# Patient Record
Sex: Female | Born: 1960
Health system: Southern US, Community
[De-identification: ages and names within clinical notes are randomized; demographics above are authoritative.]

## PROBLEM LIST (undated history)

## (undated) DIAGNOSIS — I10 Essential (primary) hypertension: Secondary | ICD-10-CM

## (undated) DIAGNOSIS — F419 Anxiety disorder, unspecified: Secondary | ICD-10-CM

## (undated) DIAGNOSIS — F329 Major depressive disorder, single episode, unspecified: Secondary | ICD-10-CM

## (undated) DIAGNOSIS — I872 Venous insufficiency (chronic) (peripheral): Secondary | ICD-10-CM

## (undated) DIAGNOSIS — E079 Disorder of thyroid, unspecified: Secondary | ICD-10-CM

## (undated) DIAGNOSIS — E785 Hyperlipidemia, unspecified: Secondary | ICD-10-CM

## (undated) DIAGNOSIS — F32A Depression, unspecified: Secondary | ICD-10-CM

## (undated) HISTORY — PX: BIOPSY THYROID: PRO38

## (undated) HISTORY — DX: Essential (primary) hypertension: I10

## (undated) HISTORY — PX: OTHER SURGICAL HISTORY: SHX169

## (undated) HISTORY — DX: Depression, unspecified: F32.A

## (undated) HISTORY — DX: Disorder of thyroid, unspecified: E07.9

## (undated) HISTORY — DX: Hyperlipidemia, unspecified: E78.5

## (undated) HISTORY — PX: BUNIONECTOMY: SHX129

## (undated) HISTORY — DX: Major depressive disorder, single episode, unspecified: F32.9

## (undated) HISTORY — DX: Anxiety disorder, unspecified: F41.9

## (undated) HISTORY — DX: Venous insufficiency (chronic) (peripheral): I87.2

---

## 1999-06-03 ENCOUNTER — Other Ambulatory Visit: Admission: RE | Admit: 1999-06-03 | Discharge: 1999-06-03 | Payer: Self-pay | Admitting: Obstetrics and Gynecology

## 2000-06-08 ENCOUNTER — Other Ambulatory Visit: Admission: RE | Admit: 2000-06-08 | Discharge: 2000-06-08 | Payer: Self-pay | Admitting: Obstetrics and Gynecology

## 2002-08-17 ENCOUNTER — Other Ambulatory Visit: Admission: RE | Admit: 2002-08-17 | Discharge: 2002-08-17 | Payer: Self-pay | Admitting: Obstetrics and Gynecology

## 2003-11-12 ENCOUNTER — Other Ambulatory Visit: Admission: RE | Admit: 2003-11-12 | Discharge: 2003-11-12 | Payer: Self-pay | Admitting: Obstetrics and Gynecology

## 2005-01-08 ENCOUNTER — Other Ambulatory Visit: Admission: RE | Admit: 2005-01-08 | Discharge: 2005-01-08 | Payer: Self-pay | Admitting: Obstetrics and Gynecology

## 2007-01-17 ENCOUNTER — Inpatient Hospital Stay (HOSPITAL_COMMUNITY): Admission: RE | Admit: 2007-01-17 | Discharge: 2007-01-18 | Payer: Self-pay | Admitting: Obstetrics and Gynecology

## 2007-01-17 ENCOUNTER — Encounter (INDEPENDENT_AMBULATORY_CARE_PROVIDER_SITE_OTHER): Payer: Self-pay | Admitting: *Deleted

## 2007-05-22 ENCOUNTER — Inpatient Hospital Stay (HOSPITAL_COMMUNITY): Admission: AD | Admit: 2007-05-22 | Discharge: 2007-05-22 | Payer: Self-pay | Admitting: Obstetrics and Gynecology

## 2007-11-04 ENCOUNTER — Encounter: Admission: RE | Admit: 2007-11-04 | Discharge: 2007-11-04 | Payer: Self-pay | Admitting: Endocrinology

## 2007-12-30 ENCOUNTER — Encounter: Admission: RE | Admit: 2007-12-30 | Discharge: 2007-12-30 | Payer: Self-pay | Admitting: Obstetrics and Gynecology

## 2009-02-08 ENCOUNTER — Ambulatory Visit: Payer: Self-pay | Admitting: Internal Medicine

## 2010-01-21 ENCOUNTER — Telehealth: Payer: Self-pay | Admitting: Cardiovascular Disease

## 2010-01-21 ENCOUNTER — Encounter: Payer: Self-pay | Admitting: Cardiovascular Disease

## 2010-02-12 DIAGNOSIS — E785 Hyperlipidemia, unspecified: Secondary | ICD-10-CM

## 2010-02-12 DIAGNOSIS — I10 Essential (primary) hypertension: Secondary | ICD-10-CM

## 2010-02-17 ENCOUNTER — Telehealth: Payer: Self-pay | Admitting: Cardiovascular Disease

## 2010-03-17 ENCOUNTER — Telehealth: Payer: Self-pay | Admitting: Cardiovascular Disease

## 2010-03-25 ENCOUNTER — Telehealth: Payer: Self-pay | Admitting: Cardiovascular Disease

## 2010-04-01 ENCOUNTER — Telehealth: Payer: Self-pay | Admitting: Cardiovascular Disease

## 2010-04-15 ENCOUNTER — Ambulatory Visit: Payer: Self-pay | Admitting: Cardiovascular Disease

## 2010-05-20 ENCOUNTER — Telehealth (INDEPENDENT_AMBULATORY_CARE_PROVIDER_SITE_OTHER): Payer: Self-pay | Admitting: *Deleted

## 2010-06-02 ENCOUNTER — Telehealth: Payer: Self-pay | Admitting: Cardiovascular Disease

## 2010-06-16 ENCOUNTER — Telehealth: Payer: Self-pay | Admitting: Cardiovascular Disease

## 2010-06-17 ENCOUNTER — Encounter: Payer: Self-pay | Admitting: Cardiovascular Disease

## 2010-10-29 ENCOUNTER — Encounter: Payer: Self-pay | Admitting: Cardiovascular Disease

## 2010-11-06 ENCOUNTER — Ambulatory Visit: Payer: Self-pay | Admitting: Cardiovascular Disease

## 2010-11-07 ENCOUNTER — Encounter: Payer: Self-pay | Admitting: Cardiovascular Disease

## 2010-11-10 ENCOUNTER — Ambulatory Visit: Payer: Self-pay | Admitting: Cardiovascular Disease

## 2010-11-20 ENCOUNTER — Encounter
Admission: RE | Admit: 2010-11-20 | Discharge: 2010-11-20 | Payer: Self-pay | Source: Home / Self Care | Attending: Obstetrics and Gynecology | Admitting: Obstetrics and Gynecology

## 2010-12-25 NOTE — Progress Notes (Signed)
Summary: BP med qestion  Phone Note Call from Patient Call back at [phone]   Caller: SELF Call For: [facility] Summary of Call: PT STATES THAT SHE WAS EXPECTING A CALL FROM THE NURSE YESTERDAY Initial call taken by: Harlon Flor,  Mar 25, 2010 10:43 AM  Follow-up for Phone Call        Called spoke with pt.  Pt states she was previously on Exforge when she saw Dr Lenard Galloway at [facility] in Nov or Dec of last year.  Had gone off all meds and was given a sample of Diovan to take as needed.  Pt states the Diovan given was not controlling BP's and she is now out of samples.  She has been out of Diovan x 2 weeks and her BP remains elevated.  Pt qustioning whether she needs rx to resume Exforge which she was prev on and was controlling BP's or should she come into office to be seen?? Follow-up by: Cloyde Reams RN,  Mar 25, 2010 11:35 AM

## 2010-12-25 NOTE — Progress Notes (Signed)
Summary: Medications  Phone Note Call from Patient Call back at [phone]   Caller: Patient Call For: RN Summary of Call: Patient is coming by this week to sign a medical record release and have her records sent from [facility] and set-up appointment.  She would like for RN to call her today to discuss a change in her medications. Initial call taken by: [place],  January 21, 2010 2:14 PM  Follow-up for Phone Call        per pt- BP has become elevated again.  Running 165/92.  Pt calling to see if Dr Mariah Milling wants her to start diovan 40mg  or 80mg  (given to her at last visit @ [facility]).  Follow-up by: Charlena Cross, RN, BSN,  January 21, 2010 3:07 PM     Appended Document: Medications per Dr. Mariah Milling- start Diovan 80mg .  When pt is out of samples we can call in losartan 100 mg daily. Charlena Cross RN BSN

## 2010-12-25 NOTE — Progress Notes (Signed)
Summary: Samples given for Vytorin 10/20 mg  Phone Note Call from Patient   Caller: Patient Summary of Call: Patient called with samples of Vytorin 10/20mg  x 3 weeks supply. Initial call taken by: Bishop Dublin, CMA,  May 20, 2010 2:06 PM

## 2010-12-25 NOTE — Progress Notes (Signed)
Summary: EDEMA  Phone Note Call from Patient Call back at [phone]   Caller: SELF Call For: [facility] Summary of Call: PT IS HAVING SWELLING IN HER HANDS AND ANKLES-HAS GAINED 15 POUNDS IN 6 WEEKS-PT HAD JUST STARTED AMLODEPINE Initial call taken by: Harlon Flor,  Apr 01, 2010 2:46 PM  Follow-up for Phone Call        Called spoke with pt.  BP better since start of Amlodipine 128/80.  Incr swelling in hands and ankles x 6 weeks.  Per Dr Mariah Milling OK to try HCTZ 25mg  once daily and d/c Amlodipine, or start Lasix 20mg  once daily as needed with Amlodipine.  Pt will monitor BP's while taking Amlodipine and as needed Lasix per pt choice.  Will call back if having to take Lasix daily. Follow-up by: Cloyde Reams RN,  Apr 01, 2010 5:11 PM    New/Updated Medications: FUROSEMIDE 20 MG TABS (FUROSEMIDE) Take 1 tablet by mouth once a day as needed Prescriptions: FUROSEMIDE 20 MG TABS (FUROSEMIDE) Take 1 tablet by mouth once a day as needed  #30 x 1   Entered by:   Cloyde Reams RN   Authorized by:   Dossie Arbour MD   Signed by:   Cloyde Reams RN on 04/01/2010   Method used:   Electronically to        [facility] [phone]* (retail)       [address] [place], [place]  [address]       Ph: [phone]       Fax: [phone]   RxID:   [id number]

## 2010-12-25 NOTE — Progress Notes (Signed)
Summary: SAMPLES vytorin 10/20mg   Phone Note Call from Patient   Summary of Call: PT CALLED AND STATED DR. Philo Kurtz WOULD GIVE HER SAMPLES OF VYTORIN IF NEEDED.  PLEASE LET HER KNOW IF SHE CAN PICK SOME UP.   [phone] Initial call taken by: Park Breed,  June 02, 2010 3:16 PM  Follow-up for Phone Call        Phone Call Completed Follow-up by: Bishop Dublin, CMA,  June 02, 2010 4:14 PM    Prescriptions: VYTORIN 10-20 MG TABS (EZETIMIBE-SIMVASTATIN) 1 tab at bedtime  #30 x 6   Entered by:   Bishop Dublin, CMA   Authorized by:   Dossie Arbour MD   Signed by:   Bishop Dublin, CMA on 06/02/2010   Method used:   Print then Give to Patient   RxID:   [id number]

## 2010-12-25 NOTE — Assessment & Plan Note (Signed)
Summary: F6M/AMD   Visit Type:  Follow-up Primary Provider:  Malva Limes  CC:  Denies chest pain or shortness of breath..  History of Present Illness: Carla [name] is a 50 old woman with a history of baseline bradycardia, hyperlipidemia, hypertension with strong family history of coronary artery disease who presents for routine followup.   Overall Carla [name] states that she is doing well. She denies any chest pain, shortness of breath. Otherwise she is active. She has gained several pounds and is trying to lose it. she has taken herself off amlodipine and reports that her blood pressure has been well-controlled on no medication. She has been able to tolerate her cholesterol medication with no difficulty and has been taking Vytorin 5/20 mg daily.  Overall she has no complaints.   EKG shows normal sinus rhythm with rate 57 beats per minute, no significant ST or T wave changes  Preventive Screening-Counseling & Management  Alcohol-Tobacco     Smoking Status: never  Caffeine-Diet-Exercise     Does Patient Exercise: yes  Current Medications (verified): 1)  Vytorin 10-40 Mg Tabs (Ezetimibe-Simvastatin) .... Take 1/2-1 Tablet By Mouth Once A Day 2)  Aspirin 81 Mg Tbec (Aspirin) .... Take One Tablet By Mouth Daily 3)  Multivitamins   Tabs (Multiple Vitamin) .Marland Kitchen.. 1 Tab Once Daily 4)  Fish Oil 1200 Mg Caps (Omega-3 Fatty Acids) .Marland Kitchen.. 1 Cap Once Daily 5)  Calcium 600 Mg Tabs (Calcium) .Marland Kitchen.. 1 Tab Once Daily 6)  Vitamin E 400 Unit Caps (Vitamin E) .Marland Kitchen.. 1 Cap Once Daily 7)  Estraderm 0.1 Mg/24hr Pttw (Estradiol) .... Change Once A Week.  Allergies (verified): No Known Drug Allergies  Past History:  Past Medical History: Last updated: 02/12/2010 Hyperlipidemia Hypertension  Family History: Last updated: 11/06/2010 Father: Living; CAD; s/p CABG x 4, Hypertension Mother: Deceased; cancer   Social History: Last updated: 11/06/2010 Part Time  Tobacco Use - No.  Alcohol Use -  yes Regular Exercise - yes  Risk Factors: Exercise: yes (11/06/2010)  Risk Factors: Smoking Status: never (11/06/2010)  Past Surgical History: hysterectomy  Family History: Father: Living; CAD; s/p CABG x 4, Hypertension Mother: Deceased; cancer   Social History: Part Time  Tobacco Use - No.  Alcohol Use - yes Regular Exercise - yes Smoking Status:  never Does Patient Exercise:  yes  Review of Systems  The patient denies fever, weight loss, weight gain, vision loss, decreased hearing, hoarseness, chest pain, syncope, dyspnea on exertion, peripheral edema, prolonged cough, abdominal pain, incontinence, muscle weakness, depression, and enlarged lymph nodes.    Vital Signs:  Patient profile:   50 year old female Height:      63 inches Weight:      162 pounds BMI:     28.80 Pulse rate:   57 / minute BP sitting:   118 / 68  (left arm) Cuff size:   regular  Vitals Entered By: Lysbeth Galas CMA (November 06, 2010 3:47 PM)  Physical Exam  General:  Well developed, well nourished, in no acute distress. Head:  normocephalic and atraumatic Neck:  Neck supple, no JVD. No masses, thyromegaly or abnormal cervical nodes. Lungs:  Clear bilaterally to auscultation and percussion. Heart:  Non-displaced PMI, chest non-tender; regular rate and rhythm, S1, S2 without murmurs, rubs or gallops. Carotid upstroke normal, no bruit.  Pedals normal pulses. No edema, no varicosities. Msk:  Back normal, normal gait. Muscle strength and tone normal. Pulses:  pulses normal in all 4 extremities Extremities:  No clubbing  or cyanosis. Neurologic:  Alert and oriented x 3. Skin:  Intact without lesions or rashes. Psych:  Normal affect.   Impression & Recommendations:  Problem # 1:  HYPERLIPIDEMIA-MIXED (ICD-272.4) cholesterol is well controlled on her current medication regimen. We have suggested that she stay on her current dose and try to lose several pounds by watching her diet and  walking.  Her updated medication list for this problem includes:    Vytorin 10-40 Mg Tabs (Ezetimibe-simvastatin) .Marland Kitchen... Take 1/2-1 tablet by mouth once a day  Problem # 2:  HYPERTENSION, BENIGN (ICD-401.1) Blood pressure appears to be well controlled without any medications. I've asked her to monitor her blood pressure.  The following medications were removed from the medication list:    Amlodipine Besylate 5 Mg Tabs (Amlodipine besylate) .Marland Kitchen... Take one tablet by mouth daily    Losartan Potassium 100 Mg Tabs (Losartan potassium) .Marland Kitchen... Take 1 tablet by mouth once a day Her updated medication list for this problem includes:    Aspirin 81 Mg Tbec (Aspirin) .Marland Kitchen... Take one tablet by mouth daily  Patient Instructions: 1)  Your physician recommends that you schedule a follow-up appointment in: 1 year 2)  Your physician recommends that you continue on your current medications as directed. Please refer to the Current Medication list given to you today. Prescriptions: VYTORIN 10-40 MG TABS (EZETIMIBE-SIMVASTATIN) Take 1/2-1 tablet by mouth once a day  #30 x 6   Entered by:   Lanny Hurst RN   Authorized by:   Dossie Arbour MD   Signed by:   Lanny Hurst RN on 11/06/2010   Method used:   Print then Give to Patient   RxID:   [id number]

## 2010-12-25 NOTE — Progress Notes (Signed)
Summary: Vytorin  Phone Note Call from Patient   Caller: Patient Call For: [place] Summary of Call: pt called and wanted to know if we could prescribe her vytorin for 10/40 instead of 10/20.  She is doing mail order and would like to cut the pills in half with a 90 day supply. Please advise.  Initial call taken by: Benedict Needy, RN,  June 16, 2010 1:50 PM  Follow-up for Phone Call        That is ok... we still need cholesterol numbers from Dr. Adrian Prince She might not need an expensive med if numbers are ok. Lets check lab first     Appended Document: Vytorin LMOM of medical records of Dr. Jeannett Senior [place]'s office to get Lipid and liver panels  Appended Document: Vytorin lab report is here from Dr. Zonia Kief please advise on vytorin  Appended Document: Vytorin ok to cut in 1/2  Appended Document: Vytorin LMOM [facility]  Appended Document: Vytorin    Clinical Lists Changes  Medications: Changed medication from VYTORIN 10-20 MG TABS (EZETIMIBE-SIMVASTATIN) 1 tab at bedtime to VYTORIN 10-40 MG TABS (EZETIMIBE-SIMVASTATIN) Take 1/2-1 tablet by mouth once a day - Signed Rx of VYTORIN 10-40 MG TABS (EZETIMIBE-SIMVASTATIN) Take 1/2-1 tablet by mouth once a day;  #90 x 4;  Signed;  Entered by: Benedict Needy, RN;  Authorized by: Dossie Arbour MD;  Method used: Print then Mail to Patient    Prescriptions: VYTORIN 10-40 MG TABS (EZETIMIBE-SIMVASTATIN) Take 1/2-1 tablet by mouth once a day  #90 x 4   Entered by:   Benedict Needy, RN   Authorized by:   Dossie Arbour MD   Signed by:   Benedict Needy, RN on 06/20/2010   Method used:   Print then Mail to Patient   RxID:   [id number]  spoke to pt on telephone she requested that the prescription be mailed to her directly.

## 2010-12-25 NOTE — Assessment & Plan Note (Signed)
Summary: NEW PT   Visit Type:  new pt visit Primary Provider:  Malva Limes  CC:  dizziness and edema...denies any other complaints today.  History of Present Illness: Carla [name] is a 46 old woman with a history of baseline bradycardia, hyperlipidemia, hypertension with strong family history of coronary artery disease who presents to reestablish care. She was last seen at [facility] December 2010.  Overall Ms. Moya states that she is doing well. She does have episodes of dizziness, feels more fatigued and has some malaise of uncertain etiology. She's noticed more swelling in her legs. She does use a diuretic on an occasional basis. She denies any chest pain, shortness of breath. Otherwise she is active. She has gained several pounds and is trying to lose it.  Current Medications (verified): 1)  Amlodipine Besylate 5 Mg Tabs (Amlodipine Besylate) .... Take One Tablet By Mouth Daily 2)  Vytorin 10-20 Mg Tabs (Ezetimibe-Simvastatin) .Marland Kitchen.. 1 Tab At Bedtime 3)  Aleve 220 Mg Tabs (Naproxen Sodium) .... 2 Tabs Two Times A Day 4)  Aspirin 81 Mg Tbec (Aspirin) .... Take One Tablet By Mouth Daily 5)  Multivitamins   Tabs (Multiple Vitamin) .Marland Kitchen.. 1 Tab Once Daily 6)  Fish Oil 1200 Mg Caps (Omega-3 Fatty Acids) .Marland Kitchen.. 1 Cap Once Daily 7)  Calcium 600 Mg Tabs (Calcium) .Marland Kitchen.. 1 Tab Once Daily 8)  Vitamin E 400 Unit Caps (Vitamin E) .Marland Kitchen.. 1 Cap Once Daily  Allergies (verified): No Known Drug Allergies  Review of Systems       The patient complains of weight gain and peripheral edema.  The patient denies fever, weight loss, vision loss, decreased hearing, hoarseness, chest pain, syncope, dyspnea on exertion, prolonged cough, abdominal pain, incontinence, muscle weakness, depression, and enlarged lymph nodes.    Vital Signs:  Patient profile:   50 year old female Height:      63 inches Weight:      165 pounds BMI:     29.33 Pulse rate:   51 / minute Pulse rhythm:    irregular BP sitting:   131 / 82  (left arm) Cuff size:   large  Vitals Entered By: Danielle Rankin, CMA (Apr 15, 2010 11:06 AM)  Physical Exam  General:  Well developed, well nourished, in no acute distress. Head:  normocephalic and atraumatic Mouth:  Teeth, gums and palate normal. Oral mucosa normal. Neck:  Neck supple, no JVD. No masses, thyromegaly or abnormal cervical nodes. Lungs:  Clear bilaterally to auscultation and percussion. Heart:  Non-displaced PMI, chest non-tender; regular rate and rhythm, S1, S2 without murmurs, rubs or gallops. Carotid upstroke normal, no bruit.  Pedals normal pulses. No edema, no varicosities. Abdomen:  Bowel sounds positive; abdomen soft and non-tender without masses, organomegaly, or hernias noted. No hepatosplenomegaly. Msk:  Back normal, normal gait. Muscle strength and tone normal. Pulses:  pulses normal in all 4 extremities Extremities:  No clubbing or cyanosis. Neurologic:  Alert and oriented x 3. Skin:  Intact without lesions or rashes. Psych:  Normal affect.    EKG  Procedure date:  04/15/2010  Findings:      normal sinus rhythm with rate 51 beats per minute, no significant ST or T wave changes.  Impression & Recommendations:  Problem # 1:  HYPERTENSION, BENIGN (ICD-401.1) she states that her blood pressure has been well-controlled O. she does not feel well on amlodipine 5 mg daily. She has malaise, dizziness, lower extremity edema. On exam there is no significant  edema. She does have baseline bradycardia which was seen on her last visit in December. It could be that the bradycardia in the setting of a vasodilator has caused her symptoms.  We will change her to losartan 50 mg daily after a amlodipine holiday. I will Have her monitor her blood pressure and pulse rate at home.  The following medications were removed from the medication list:    Furosemide 20 Mg Tabs (Furosemide) .Marland Kitchen... Take 1 tablet by mouth once a day as needed Her  updated medication list for this problem includes:    Amlodipine Besylate 5 Mg Tabs (Amlodipine besylate) .Marland Kitchen... Take one tablet by mouth daily    Aspirin 81 Mg Tbec (Aspirin) .Marland Kitchen... Take one tablet by mouth daily    Losartan Potassium 100 Mg Tabs (Losartan potassium) .Marland Kitchen... Take 1 tablet by mouth once a day  Problem # 2:  HYPERLIPIDEMIA-MIXED (ICD-272.4) we'll continue her on the Vytorin for now. She states that she takes one half tablet per day. We'll check her cholesterol which was done through Dr. Adrian Prince. she does have a strong family history and we will continue to treat her aggressively.  Her updated medication list for this problem includes:    Vytorin 10-20 Mg Tabs (Ezetimibe-simvastatin) .Marland Kitchen... 1 tab at bedtime  Patient Instructions: 1)  Your physician recommends that you schedule a follow-up appointment in: 6 months. 2)  Your physician has recommended you make the following change in your medication: Start taking Losartan 100mg  once daily. Prescriptions: LOSARTAN POTASSIUM 100 MG TABS (LOSARTAN POTASSIUM) Take 1 tablet by mouth once a day  #30 x 6   Entered by:   Cloyde Reams RN   Authorized by:   Dossie Arbour MD   Signed by:   Cloyde Reams RN on 04/15/2010   Method used:   Electronically to        [facility] [phone]* (retail)       [address] [place], [place]  [address]       Ph: [phone]       Fax: [phone]   RxID:   [id number]

## 2010-12-25 NOTE — Progress Notes (Signed)
Summary: SAMPLES  Phone Note Call from Patient Call back at [phone]   Caller: self Call For: Carla [name] Summary of Call: PT WOULD LIKE DIOVAN SAMPLES-#[phone] Initial call taken by: Harlon Flor,  March 17, 2010 8:06 AM  Follow-up for Phone Call        pt switched from Exforge to Diovan because of issues with BP control.  Pt has not felt good since switching and would like to try a different medication instead of diovan for BP.  Pt aware that Dr. Mariah Milling is away and will return next week.  Pt will monitor BP while off diovan and call back if BP becomes elevated.  Please call pt back with medication changes.  Follow-up by: Charlena Cross, RN, BSN,  March 17, 2010 9:09 AM  Additional Follow-up for Phone Call Additional follow up Details #1::        PT THINKS THAT SHE NEEDS TO SEE Carla [name] THIS WEEK-PT IS NOT TAKING ANY MEDICATION Additional Follow-up by: Harlon Flor,  Mar 24, 2010 8:28 AM    Additional Follow-up for Phone Call Additional follow up Details #2::    Do you want to see Patient?. she is requesting samples of Diovan-  there are no samples in stock. Stanton Kidney, EMT-P  Mar 24, 2010 10:40 AM   Additional Follow-up for Phone Call Additional follow up Details #3:: Details for Additional Follow-up Action Taken: we could start her on the generic of diovan (losartan 100 mg daily, start with half. Or could start amlodipine 5 mg daily   Appended Document: SAMPLES    Phone Note Outgoing Call   Call placed by: Cloyde Reams RN,  Mar 26, 2010 2:04 PM Call placed to: Patient Summary of Call: Spoke with pt.  Advised per Dr Mariah Milling Amlodipine and losartan are the 2 medications in Exforge.  D/C Exforge  perviously d/t decr BP's therefor Dr Mariah Milling was recommending to split the meds and try, since she has tried Diovan without success, recommended she try Amlodipine.  Pt agrees going to try 30 day supply to [facility] and [facility] if too expensive, or [facility] to get 90 day rx, or [facility] if not  controlling BP's.  Initial call taken by: Cloyde Reams RN,  Mar 26, 2010 2:12 PM  Follow-up for Phone Call        Pt aware rx sent to pharmacy. Follow-up by: Cloyde Reams RN,  Mar 26, 2010 2:13 PM    New/Updated Medications: AMLODIPINE BESYLATE 5 MG TABS (AMLODIPINE BESYLATE) Take one tablet by mouth daily Prescriptions: AMLODIPINE BESYLATE 5 MG TABS (AMLODIPINE BESYLATE) Take one tablet by mouth daily  #30 x 0   Entered by:   Cloyde Reams RN   Authorized by:   Dossie Arbour MD   Signed by:   Cloyde Reams RN on 03/26/2010   Method used:   Electronically to        [facility] [address] (retail)       [address]       [place], [place]  [address]       Ph: [phone]       Fax: [phone]   RxID:   [id number]

## 2010-12-25 NOTE — Letter (Signed)
Summary: Medical Record Release  Medical Record Release   Imported By: Harlon Flor 02/14/2010 07:54:03  _____________________________________________________________________  External Attachment:    Type:   Image     Comment:   External Document

## 2010-12-25 NOTE — Progress Notes (Signed)
Summary: sample pick up  Phone Note Call from Patient   Caller: Patient Summary of Call: pt calling to see if we had samples of  vytorin. pt will pick up samples at front desk. kimberly  ~:) Initial call taken by: Mercer Pod,  February 17, 2010 2:33 PM

## 2011-02-05 ENCOUNTER — Ambulatory Visit: Payer: Self-pay | Admitting: Podiatry

## 2011-04-10 NOTE — Op Note (Signed)
[name], [name]               ACCOUNT NO.:  [id number]   MEDICAL RECORD NO.:  [id number]          PATIENT TYPE:  INP   LOCATION:  9312                          FACILITY:  [facility]   PHYSICIAN:  Malva Limes, M.D.    DATE OF BIRTH:  [birth year]   DATE OF PROCEDURE:  04/19/2007  DATE OF DISCHARGE:  01/18/2007                               OPERATIVE REPORT   PREOPERATIVE DIAGNOSES:  1. Menorrhagia.  2. Stress urinary incontinence.  3. Symptomatic cystocele and rectocele.   POSTOPERATIVE DIAGNOSES:  1. Menorrhagia.  2. Stress urinary incontinence.  3. Symptomatic cystocele and rectocele.   PROCEDURE:  Laparoscopic-assisted bilateral salpingo-oophorectomy,  anterior and posterior colporrhaphy, placement of tension-free vaginal  tape, cystoscopy, and excision of right paravaginal cyst.   SURGEON:  Malva Limes, M.D.   ASSISTANT:  Randye Lobo, M.D.   ANESTHESIA:  General endotracheal.   ANTIBIOTICS:  Ancef 1 gram.   DRAINS:  Foley to bedside drainage.   ESTIMATED BLOOD LOSS:  200 mL.   SPECIMENS:  Uterus, cervix, ovaries, fallopian tubes sent to pathology.   PROCEDURE:  The patient was taken to the operating room where general  anesthetic was administered without complication.  She was then placed  in the dorsal lithotomy position and prepped with Betadine.  A Foley  catheter was placed in her bladder.  She was then draped in the usual  fashion for this procedure.  Her umbilicus was injected with 0.25%  Marcaine.   A vertical skin incision was made. The fascia was grasped and entered  with the Mayo scissors.  The peritoneum was entered bluntly with  hemostats.  An 0 Vicryl suture was placed in a pursestring fashion.  The  trocar was placed in the abdominal cavity.  3 liters of carbon dioxide  was insufflated.  The scope was then placed, and examination revealed  that the patient had no evidence of adhesions or endometriosis.  She did  have several small fibroids.  The  fallopian tubes and ovaries were  normal bilaterally.  At this point, 5-mm ports were placed in the right  and left lower quadrant under direct visualization.  The right ureter  was then identified.  The infundibulopelvic ligament was grasped and  cauterized and transected.  Next, the right round ligament was  cauterized and transected, and the broad ligament was cauterized and  transected down to the level of the uterine artery.  A similar procedure  was performed on the opposite side.   Once this was concluded, attention was placed in the vagina.  The gas in  the abdominal cavity was released and the scope removed.  A weighted  speculum was placed in the vagina.  The cervix was injected with 1%  lidocaine with epinephrine. The posterior cul-de-sac was entered  sharply.  Uterosacral ligaments were bilaterally clamped, cut, and  ligated with 0 Monocryl suture.  The cervix was then circumscribed, and  the anterior cul-de-sac was entered sharply.  The cardinal ligaments  were serially clamped, cut, and ligated with 0 Monocryl suture.  The  uterine vessels were then bilaterally clamped, cut,  and ligated with 0  Monocryl suture.  The specimen was then removed.  At this point the  posterior cuff was run using 2-0 Vicryl suture in a running locking  fashion.  Cul-de-sac repair was then performed.  At this point, Dr.  Edward Jolly took over the procedure.  Please see her dictated operative note.           ______________________________  Malva Limes, M.D.     MA/MEDQ  D:  04/19/2007  T:  04/19/2007  Job:  [id number]

## 2011-04-10 NOTE — Op Note (Signed)
Carla [name], Carla [name]               ACCOUNT NO.:  [id number]   MEDICAL RECORD NO.:  [id number]          PATIENT TYPE:  INP   LOCATION:  9312                          FACILITY:  [facility]   PHYSICIAN:  Randye Lobo, M.D.   DATE OF BIRTH:  1961-04-27   DATE OF PROCEDURE:  01/17/2007  DATE OF DISCHARGE:                               OPERATIVE REPORT   PREOPERATIVE DIAGNOSES.:  1. Genuine stress incontinence.  2. Incomplete uterovaginal prolapse.   POSTOPERATIVE DIAGNOSES:  1. Genuine stress incontinence.  2. Incomplete uterovaginal prolapse.  3. Paravaginal cyst.   PROCEDURE:  1. Tension-free vaginal tape.  2. Suburethral sling with cystoscopy.  3. Excision of paravaginal cyst.  4. Anterior and posterior colporrhaphy.   SURGEON:  Randye Lobo, M.D.   ASSISTANT:  Malva Limes, MD.   ANESTHESIA:  General endotracheal.   ESTIMATED BLOOD LOSS:  100 mL.   COMPLICATIONS:  None.   INDICATIONS FOR PROCEDURE:  The patient is a 50 year old para 1  Caucasian female who is referred by Dr. Malva Limes for evaluation of  urinary incontinence.  The patient is planning laparoscopically assisted  vaginal hysterectomy with bilateral salpingo oophorectomy with Dr.  Dareen Piano; due to a history of symptomatic fibroids and abnormal Pap  smears.  The patient reports a history of urinary incontinence with  stressful maneuvers, in addition to urge-related incontinence.  Urodynamic testing in the office confirmed the presence of genuine  stress incontinence.   On physical examination the patient was noted to have a first-degree  cystocele and a first-degree rectocele; on further questioning, the  patient reports a history of constipation with the need for perineal  splinting and digital vaginal pressure to have bowel movements.   The patient desires to proceed with surgical treatment for her urinary  incontinence and her prolapse at the time of her hysterectomy with Dr.  Dareen Piano.  The risks,  benefits and alternatives have been discussed with  the patient, who wishes to proceed.   FINDINGS:  The patient is noted to have a first-degree cystocele and a  first-degree rectocele.   During the dissection of the anterior vaginal wall for the anterior  colporrhaphy and the sling, a 3 cm x 2 cm right paravaginal cyst was  encountered, just under the level of the vaginal mucosa, anterior to the  bladder and lateral to the urethra.  The cyst contained greenish mucousy  material.  This cyst was opened and explored, and there was no  communication with the bladder or the urethra.   Cystoscopy demonstrated a normal bladder throughout 360 degrees,  including the bladder dome and trigone.  The ureters were noted to be  patent bilaterally.  There was no evidence of a foreign body in the  bladder or the urethra during placement of the sling.   SPECIMENS:  The paravaginal cyst wall was sent to pathology.   PROCEDURE:  The patient was brought to the operating room after she was  re-identified in the preoperative holding area.  She received general  endotracheal anesthesia and was then placed in the dorsal lithotomy  position.  At this point, Dr. Dareen Piano proceeded with the  laparoscopically assisted vaginal hysterectomy with bilateral salpingo-  oophorectomy.  Please refer to this dictation separately.   After Dr. Dareen Piano did a running locked suture of the posterior vaginal  cuff, a Foley catheter was placed inside the patient's bladder.  I then  took over the remainder of the procedure.   A McCall culdoplasty suture was placed in the posterior vaginal cuff.  This suture was brought through the vagina and into the cul-de-sac at  the 6 o'clock position, through the distal left uterosacral ligament,  across the posterior cul-de-sac in a pursestring fashion, down through  the distal right uterosacral ligament, and then out through the vagina  again at the 6 o'clock position.  This suture  was held and tied at the  end of the case, at which time it provided excellent support and  elevation of the vaginal cuff.   Allis clamps were then used to mark the anterior vaginal wall, which was  injected with 1% lidocaine with 1:100,000 of epinephrine.  The anterior  vaginal wall mucosa was then opened vertically with the Metzenbaum  scissors.  The bladder was dissected off of the overlying vaginal tissue  bilaterally.  During the course of this dissection the right paravaginal  cyst was encountered.  The cyst was opened and was explored with a  uterine sound, and found to have no communication with the bladder or  the urethra.  The remainder of the cyst wall was therefore opened and  excess cyst wall was excised and sent to pathology.  The base of the  cyst was cauterized.  The small portion of the cyst which remained was  then sutured close with a running locked suture of 2-0 Vicryl.  The  dissection for the anterior colporrhaphy was completed and was brought  to the level of the pubic rami anteriorly, and down to the level of the  uterosacral ligaments inferiorly.   The sling was performed next in a top-down fashion.  Then 1 cm incisions  were marked 2.5 cm to the right and left of the midline.  With a Foley  catheter left to gravity drainage, the abdominal needle passer was  placed; first through the right suprapubic incision, and then out  through the vagina at the level of the mid urethra and lateral to it on  the ipsilateral side.  The same procedure that was performed on the  right-hand side was then repeated on the left-hand side.  The Foley  catheter was removed at this time and cystoscopy was performed in the  findings are as noted above.  The Foley catheter was then placed in  again and all cystoscopy fluid was drained.  The tension-free vaginal tape sling was then attached to the abdominal needle passers and was  brought up through the suprapubic incisions bilaterally.   Final tension  on the sling was adjusted.  Then a Kelly clamp was placed between the  sling and the urethra, and the plastic sheaths were removed from the  surrounding sling.  Excess sling material was excised.  The anterior  colporrhaphy was then performed with vertical mattress sutures of 2-0  Vicryl, for excellent reduction of the cystocele.  Excess vaginal mucosa  was trimmed and the anterior vaginal wall was closed with a running  locked suture of 2-0 Vicryl.  The vaginal cuff was then closed with a  running locked suture of 0 Vicryl.  Hemostasis was noted to be good.  The posterior colporrhaphy was performed last.  Allis clamps were used  to mark the midline of the posterior vaginal wall.  The vaginal mucosa  was injected with 1% lidocaine with 1:100,000 of epinephrine.  A  triangular wedge of epithelium was excised from the perineal body.  The  vaginal mucosa was then incised vertically with the Metzenbaum scissors.  The perirectal fascia was dissected off of the overlying vaginal mucosa  bilaterally to the top of the patient's rectocele.  The rectocele repair  was then performed with a series of vertical mattress sutures of 2-0  Vicryl.  The top suture was actually a pursestring of 2-0 Vicryl for  reduction at the top of the rectocele.  A crown stitch of 0 Vicryl was  placed at the level of the perineal body.  Excess vaginal mucosa was  then trimmed and the posterior vaginal wall was closed with a running  locked suture of 2-0 Vicryl, which was continued along the perineum as  for a subcuticular episiotomy closure.   There was some bleeding noted along the mucosal edge of the anterior  vaginal wall, and this was controlled with a figure-of-eight suture of 2-  0 Vicryl.  A packing with Estrace cream was then placed inside the  vagina.   The suprapubic incisions were closed with subcuticular sutures of 3-0  Vicryl.  Final rectal exam confirmed the absence of suture in the   rectum.   Dr. Dareen Piano performed final laparoscopy at this time, and then  performed closure of the abdominal incisions.  Again, please refer to  this dictation separately.   This concluded the patient's procedure.  There were no complications.  All needle, instrument, sponge counts were correct.  The patient was  escorted to recovery room in stable and awake condition.      Randye Lobo, M.D.  Electronically Signed     BES/MEDQ  D:  01/17/2007  T:  01/17/2007  Job:  [id number]

## 2011-04-21 ENCOUNTER — Other Ambulatory Visit: Payer: Self-pay

## 2011-04-21 MED ORDER — EZETIMIBE-SIMVASTATIN 10-40 MG PO TABS: ORAL_TABLET | ORAL | Status: DC

## 2011-04-21 NOTE — Telephone Encounter (Signed)
She is taking Vytorin 10/40 mg take 1/2 - 1 one tablet daily.

## 2011-05-05 ENCOUNTER — Other Ambulatory Visit: Payer: Self-pay | Admitting: Obstetrics and Gynecology

## 2011-05-06 ENCOUNTER — Ambulatory Visit (INDEPENDENT_AMBULATORY_CARE_PROVIDER_SITE_OTHER): Payer: Managed Care, Other (non HMO) | Admitting: *Deleted

## 2011-05-06 ENCOUNTER — Encounter: Payer: Self-pay | Admitting: *Deleted

## 2011-05-06 ENCOUNTER — Telehealth: Payer: Self-pay | Admitting: Cardiovascular Disease

## 2011-05-06 DIAGNOSIS — R079 Chest pain, unspecified: Secondary | ICD-10-CM

## 2011-05-06 NOTE — Progress Notes (Signed)
Pt in today after calling this AM with c/o chest pressure and elevated BP of 160/110. Pt has fam hx of CAD. Pt denies SOB, and states that for the past week she has had pressure in chest, and has only checked BP for past 2 days and it has been elevated. Pt's EKG today shows NSR with HR 62 BP 146/89. Pt does state that her father-in-law's health is not doing well and they are anticipating his passing any time now, and states she is under a great amount of stress for this reason. Discussed with Dr. Mariah Milling, and have advised pt to see pcp for anxiety. Also advised pt to try walking, or any type of relaxing technique to help her through stress. Also notified pt that if symptoms persist or increase and she is concerned, per Dr. Mariah Milling, she can schedule a myoview to rule out blockage. Pt ok with this, and states she will call back with any concerns.

## 2011-05-06 NOTE — Telephone Encounter (Signed)
Spoke to pt, she also reports this AM onset of chest pressure for >1 week that has increased in intensity, and she has only been recording BP for past 2 days and has stayed significantly higher than her normal. She reports "not feeling right" for the past week. Advised pt come in now for EKG. Pt states she is on her way.

## 2011-05-06 NOTE — Telephone Encounter (Signed)
BP is 160/110.  Please advise.

## 2011-06-05 ENCOUNTER — Encounter: Payer: Self-pay | Admitting: Cardiovascular Disease

## 2011-09-09 LAB — URINALYSIS, ROUTINE W REFLEX MICROSCOPIC
Bilirubin Urine: NEGATIVE
Ketones, ur: NEGATIVE
Leukocytes, UA: NEGATIVE
Nitrite: NEGATIVE
Protein, ur: NEGATIVE
Specific Gravity, Urine: 1.015
Urobilinogen, UA: 0.2

## 2011-09-09 LAB — URINE MICROSCOPIC-ADD ON

## 2011-10-29 ENCOUNTER — Encounter: Payer: Self-pay | Admitting: Cardiovascular Disease

## 2011-11-06 ENCOUNTER — Encounter: Payer: Self-pay | Admitting: Cardiovascular Disease

## 2011-11-06 ENCOUNTER — Ambulatory Visit (INDEPENDENT_AMBULATORY_CARE_PROVIDER_SITE_OTHER): Payer: Managed Care, Other (non HMO) | Admitting: Cardiovascular Disease

## 2011-11-06 VITALS — BP 140/90 | HR 67 | Ht 63.0 in | Wt 173.0 lb

## 2011-11-06 DIAGNOSIS — I10 Essential (primary) hypertension: Secondary | ICD-10-CM

## 2011-11-06 DIAGNOSIS — E785 Hyperlipidemia, unspecified: Secondary | ICD-10-CM

## 2011-11-06 NOTE — Patient Instructions (Signed)
You are doing well. No medication changes were made.  Please call us if you have new issues that need to be addressed before your next appt.  The office will contact you for a follow up Appt. In 12 months  

## 2011-11-06 NOTE — Assessment & Plan Note (Signed)
We will try to obtain the lipid panel from Dr. Evlyn Kanner. She does have a very strong family history of coronary artery disease.

## 2011-11-06 NOTE — Assessment & Plan Note (Signed)
Blood pressure is borderline elevated. We have asked her to increase her exercise and watch her diet in an effort to lose weight. Her weight is up 10 pounds from her previous visit last year

## 2011-11-06 NOTE — Progress Notes (Signed)
Patient ID: Carla [name], female    DOB: Oct 10, 1961, 50 y.o.   MRN: [id number]  HPI Comments: Ms. Carla [name] is a 50 old woman with a history of baseline bradycardia, hyperlipidemia, hypertension with strong family history of coronary artery disease who presents for routine followup.     Overall Ms. Carla [name] states that she is doing well. She has gained 10 pounds over the past year from 162 pounds to 172 pounds.  She denies any chest pain, shortness of breath. Otherwise she is active.   She has been able to tolerate her cholesterol medication with no difficulty and has been taking Vytorin 5/20 mg daily. In the past, she was on amlodipine to stop this on her own Secondary to fatigue. Blood pressure today is mildly elevated. Overall she has no complaints.     She reports that cholesterol was recently done by Dr. Adrian Prince in [place] EKG shows normal sinus rhythm with rate 67 beats per minute, no significant ST or T wave changes      Outpatient Encounter Prescriptions as of 11/06/2011  Medication Sig Dispense Refill  . aspirin 81 MG tablet Take 81 mg by mouth daily.        . calcium carbonate (OS-CAL) 600 MG TABS Take 600 mg by mouth daily.        Marland Kitchen estradiol (ESTRADERM) 0.1 MG/24HR Place 1 patch onto the skin once a week.        . ezetimibe-simvastatin (VYTORIN) 10-40 MG per tablet Take 1/2 tablet by mouth everyday.  90 tablet  3  . Multiple Vitamin (MULTIVITAMIN) tablet Take 1 tablet by mouth daily.        . Omega-3 Fatty Acids (FISH OIL) 1200 MG CAPS Take 1 capsule by mouth daily.        . vitamin E 400 UNIT capsule Take 400 Units by mouth daily.          Review of Systems  Constitutional: Negative.   HENT: Negative.   Eyes: Negative.   Respiratory: Negative.   Cardiovascular: Negative.   Gastrointestinal: Negative.   Musculoskeletal: Negative.   Skin: Negative.   Neurological: Negative.   Hematological: Negative.   Psychiatric/Behavioral: Negative.   All other systems  reviewed and are negative.     BP 140/90  Pulse 67  Ht 5\' 3"  (1.6 m)  Wt 173 lb (78.472 kg)  BMI 30.65 kg/m2  Physical Exam  Nursing note and vitals reviewed. Constitutional: She is oriented to person, place, and time. She appears well-developed and well-nourished.  HENT:  Head: Normocephalic.  Nose: Nose normal.  Mouth/Throat: Oropharynx is clear and moist.  Eyes: Conjunctivae are normal. Pupils are equal, round, and reactive to light.  Neck: Normal range of motion. Neck supple. No JVD present.  Cardiovascular: Normal rate, regular rhythm, S1 normal, S2 normal, normal heart sounds and intact distal pulses.  Exam reveals no gallop and no friction rub.   No murmur heard. Pulmonary/Chest: Effort normal and breath sounds normal. No respiratory distress. She has no wheezes. She has no rales. She exhibits no tenderness.  Abdominal: Soft. Bowel sounds are normal. She exhibits no distension. There is no tenderness.  Musculoskeletal: Normal range of motion. She exhibits no edema and no tenderness.  Lymphadenopathy:    She has no cervical adenopathy.  Neurological: She is alert and oriented to person, place, and time. Coordination normal.  Skin: Skin is warm and dry. No rash noted. No erythema.  Psychiatric: She has a normal mood and affect. Her  behavior is normal. Judgment and thought content normal.         Assessment and Plan

## 2012-03-01 ENCOUNTER — Telehealth: Payer: Self-pay | Admitting: Cardiovascular Disease

## 2012-03-01 NOTE — Telephone Encounter (Signed)
Carla [name] patient called while you were at lunch and wants you to call her back regarding vytorin.

## 2012-03-01 NOTE — Telephone Encounter (Signed)
Notified patient samples available to pick up of the vytorin 10/40 mg.

## 2012-04-27 ENCOUNTER — Telehealth: Payer: Self-pay | Admitting: Cardiovascular Disease

## 2012-04-27 NOTE — Telephone Encounter (Signed)
I called pt to tell her samples left at [facility].

## 2012-04-27 NOTE — Telephone Encounter (Signed)
Pt calling to see if she can have samples of Vytorin

## 2012-05-30 ENCOUNTER — Other Ambulatory Visit: Payer: Self-pay | Admitting: Obstetrics and Gynecology

## 2012-05-30 DIAGNOSIS — R928 Other abnormal and inconclusive findings on diagnostic imaging of breast: Secondary | ICD-10-CM

## 2012-05-31 ENCOUNTER — Ambulatory Visit
Admission: RE | Admit: 2012-05-31 | Discharge: 2012-05-31 | Disposition: A | Discharge status: Home / Self Care | Payer: Managed Care, Other (non HMO) | Source: Ambulatory Visit | Attending: Obstetrics and Gynecology | Admitting: Obstetrics and Gynecology

## 2012-05-31 DIAGNOSIS — R928 Other abnormal and inconclusive findings on diagnostic imaging of breast: Secondary | ICD-10-CM

## 2012-08-29 ENCOUNTER — Telehealth: Payer: Self-pay | Admitting: Cardiovascular Disease

## 2012-08-29 NOTE — Telephone Encounter (Signed)
Pt has question concerning her vytorin

## 2012-08-30 NOTE — Telephone Encounter (Signed)
See below

## 2012-08-30 NOTE — Telephone Encounter (Signed)
Patient wanted samples of Vytorin. Patient will pick up samples today.

## 2012-11-04 ENCOUNTER — Encounter: Payer: Self-pay | Admitting: Cardiovascular Disease

## 2012-11-04 ENCOUNTER — Ambulatory Visit (INDEPENDENT_AMBULATORY_CARE_PROVIDER_SITE_OTHER): Payer: Managed Care, Other (non HMO) | Admitting: Cardiovascular Disease

## 2012-11-04 VITALS — BP 140/90 | HR 67 | Ht 62.0 in | Wt 164.0 lb

## 2012-11-04 DIAGNOSIS — I10 Essential (primary) hypertension: Secondary | ICD-10-CM

## 2012-11-04 DIAGNOSIS — E785 Hyperlipidemia, unspecified: Secondary | ICD-10-CM

## 2012-11-04 NOTE — Assessment & Plan Note (Signed)
Cholesterol last year was recently well controlled. We have suggested she continue her statin. If she would like to try simvastatin 40 mg daily instead of Vytorin, she could certainly do this. She will call us after her next lipid panel if she would like to try this.

## 2012-11-04 NOTE — Assessment & Plan Note (Signed)
We have suggested that she closely monitor her blood pressure at home. Work on weight loss and exercise. If systolic pressures running 1 and 140 on a regular basis, we will restart medication

## 2012-11-04 NOTE — Progress Notes (Signed)
Patient ID: Carla [name], female    DOB: 24-Mar-1961, 51 y.o.   MRN: [id number]  HPI Comments: Carla [name] is a 51 old woman with a history of baseline bradycardia, hyperlipidemia, hypertension with strong family history of coronary artery disease who presents for routine followup.     Overall Carla [name] states that she is doing well. Her weight continues to be modestly.  She denies any chest pain, shortness of breath. Otherwise she is active.   She has been able to tolerate her cholesterol medication with no difficulty and has been taking Vytorin 5/20 mg daily. She has not been monitoring her blood pressure at home. In the past she was on amlodipine. Overall she has no complaints.    Cholesterol last year increased from 160 range to 170 range EKG shows normal sinus rhythm with rate 66 beats per minute, no significant ST or T wave changes      Outpatient Encounter Prescriptions as of 11/04/2012  Medication Sig Dispense Refill  . aspirin 81 MG tablet Take 81 mg by mouth daily.        . calcium carbonate (OS-CAL) 600 MG TABS Take 600 mg by mouth daily.        Marland Kitchen estrogens, conjugated, (PREMARIN) 0.625 MG tablet Take 0.625 mg by mouth daily. Take daily for 21 days then do not take for 7 days.      Marland Kitchen ezetimibe-simvastatin (VYTORIN) 10-40 MG per tablet Take 1/2-1 tablet by mouth everyday.  90 tablet  3  . Multiple Vitamin (MULTIVITAMIN) tablet Take 1 tablet by mouth daily.        . Omega-3 Fatty Acids (FISH OIL) 1200 MG CAPS Take 1 capsule by mouth daily.        . vitamin E 400 UNIT capsule Take 400 Units by mouth daily.        . [DISCONTINUED] estradiol (ESTRADERM) 0.1 MG/24HR Place 1 patch onto the skin once a week.           Review of Systems  Constitutional: Negative.   HENT: Negative.   Eyes: Negative.   Respiratory: Negative.   Cardiovascular: Negative.   Gastrointestinal: Negative.   Musculoskeletal: Negative.   Skin: Negative.   Neurological: Negative.   Hematological:  Negative.   Psychiatric/Behavioral: Negative.   All other systems reviewed and are negative.     BP 140/90  Pulse 67  Ht 5\' 2"  (1.575 m)  Wt 164 lb (74.39 kg)  BMI 30.00 kg/m2  Physical Exam  Nursing note and vitals reviewed. Constitutional: She is oriented to person, place, and time. She appears well-developed and well-nourished.  HENT:  Head: Normocephalic.  Nose: Nose normal.  Mouth/Throat: Oropharynx is clear and moist.  Eyes: Conjunctivae normal are normal. Pupils are equal, round, and reactive to light.  Neck: Normal range of motion. Neck supple. No JVD present.  Cardiovascular: Normal rate, regular rhythm, S1 normal, S2 normal, normal heart sounds and intact distal pulses.  Exam reveals no gallop and no friction rub.   No murmur heard. Pulmonary/Chest: Effort normal and breath sounds normal. No respiratory distress. She has no wheezes. She has no rales. She exhibits no tenderness.  Abdominal: Soft. Bowel sounds are normal. She exhibits no distension. There is no tenderness.  Musculoskeletal: Normal range of motion. She exhibits no edema and no tenderness.  Lymphadenopathy:    She has no cervical adenopathy.  Neurological: She is alert and oriented to person, place, and time. Coordination normal.  Skin: Skin is warm and dry.  No rash noted. No erythema.  Psychiatric: She has a normal mood and affect. Her behavior is normal. Judgment and thought content normal.         Assessment and Plan

## 2012-11-04 NOTE — Patient Instructions (Addendum)
You are doing well. No medication changes were made.  Goal blood pressure is <140 in the top, <90 on the bottom  Please call us if you have new issues that need to be addressed before your next appt.  Your physician wants you to follow-up in: 12 months.  You will receive a reminder letter in the mail two months in advance. If you don't receive a letter, please call our office to schedule the follow-up appointment.

## 2013-02-22 ENCOUNTER — Telehealth: Payer: Self-pay | Admitting: *Deleted

## 2013-02-22 NOTE — Telephone Encounter (Signed)
lmtcb

## 2013-02-22 NOTE — Telephone Encounter (Signed)
I discussed with Dr. Mariah Milling who advised to start amlodipine 5 mg PO qd OR losartan 100 mg daily I will call pt to advise

## 2013-02-22 NOTE — Telephone Encounter (Signed)
Pt says she has been under more stress than usual x 3 months BP has been high lately Says she used to take amlodipine (?) for high BP BP yesterday=158/100, blurred vision Would like to go back on BP med She says we gave her "samples" last time. I explained we do not have samples of amlodipine, so it may have been another medication I will talk with Dr. Mariah Milling about this and call her back Understanding verb

## 2013-02-22 NOTE — Telephone Encounter (Signed)
Pt stopped taking her bp meds about 6 mths ago. Pt states that her BP is running high again 158/100. Wants to know if she needs to start taking her meds again. Pt cant remembered what the medication was

## 2013-02-23 ENCOUNTER — Other Ambulatory Visit: Payer: Self-pay

## 2013-02-23 MED ORDER — AMLODIPINE BESYLATE 5 MG PO TABS: 5.0000 mg | ORAL_TABLET | Freq: Every day | ORAL | Status: DC

## 2013-02-23 NOTE — Telephone Encounter (Signed)
Pt informed Willing to try amlodipine 5 mg PO QD Will send to pharmacy

## 2013-04-18 ENCOUNTER — Telehealth: Payer: Self-pay

## 2013-04-18 NOTE — Telephone Encounter (Signed)
Pt has question regarding Vytorin, requests you to call her back.

## 2013-04-19 NOTE — Telephone Encounter (Signed)
Notified patient samples available for Vytorin 10/40 mg 28 tablets.

## 2013-05-03 ENCOUNTER — Telehealth: Payer: Self-pay

## 2013-05-03 NOTE — Telephone Encounter (Signed)
Amlodipine could be causing edema If edema is significant, could cut the amlodipine in half or stop the medication  For high blood pressure will likely need to add additional medication Has she tried lisinopril or losartan? If not we'll try lisinopril 10 mg daily This could be titrated up slowly over several weeks

## 2013-05-03 NOTE — Telephone Encounter (Signed)
Pt says she is taking amlodipine 5 mg daily as prescribed but BP remains elevated and she has noticed some blurred vision and edema in LE Has been to ophthalmologist who has found no new issues with eyes BP=146/96 She asks for an appt with Dr. Mariah Milling I explained this may be something we can change via telephone She is ok with this I will make him aware

## 2013-05-03 NOTE — Telephone Encounter (Signed)
Pt states her BP is 142/96 , states she is having dizziness and blurred vision. Please call

## 2013-05-04 ENCOUNTER — Other Ambulatory Visit: Payer: Self-pay

## 2013-05-04 MED ORDER — LISINOPRIL 10 MG PO TABS: 10.0000 mg | ORAL_TABLET | Freq: Every day | ORAL | Status: DC

## 2013-05-04 NOTE — Telephone Encounter (Signed)
Pt informed Says edema is significantly worse Will stop amlodipine She has never tried lisinopril We will start lisinopril 10 mg daily She asks that I check price for her at [facility] and call her back  This is a $4 medication at [facility]  I will call to inform pt

## 2013-05-04 NOTE — Telephone Encounter (Signed)
Pt informed Understanding verb RX sent to pharm 

## 2013-06-02 ENCOUNTER — Other Ambulatory Visit: Payer: Self-pay | Admitting: Obstetrics and Gynecology

## 2013-06-06 ENCOUNTER — Other Ambulatory Visit: Payer: Self-pay | Admitting: Obstetrics and Gynecology

## 2013-06-06 DIAGNOSIS — R928 Other abnormal and inconclusive findings on diagnostic imaging of breast: Secondary | ICD-10-CM

## 2013-06-19 ENCOUNTER — Ambulatory Visit
Admission: RE | Admit: 2013-06-19 | Discharge: 2013-06-19 | Disposition: A | Discharge status: Home / Self Care | Payer: Managed Care, Other (non HMO) | Source: Ambulatory Visit | Attending: Obstetrics and Gynecology | Admitting: Obstetrics and Gynecology

## 2013-06-19 DIAGNOSIS — R928 Other abnormal and inconclusive findings on diagnostic imaging of breast: Secondary | ICD-10-CM

## 2013-09-18 LAB — HM COLONOSCOPY

## 2013-09-20 ENCOUNTER — Other Ambulatory Visit: Payer: Self-pay | Admitting: Gastroenterology

## 2013-11-06 ENCOUNTER — Ambulatory Visit (INDEPENDENT_AMBULATORY_CARE_PROVIDER_SITE_OTHER): Payer: Managed Care, Other (non HMO) | Admitting: Cardiovascular Disease

## 2013-11-06 ENCOUNTER — Encounter (INDEPENDENT_AMBULATORY_CARE_PROVIDER_SITE_OTHER): Payer: Self-pay

## 2013-11-06 ENCOUNTER — Encounter: Payer: Self-pay | Admitting: Cardiovascular Disease

## 2013-11-06 VITALS — BP 138/98 | HR 59 | Ht 63.0 in | Wt 169.2 lb

## 2013-11-06 DIAGNOSIS — E785 Hyperlipidemia, unspecified: Secondary | ICD-10-CM

## 2013-11-06 DIAGNOSIS — I10 Essential (primary) hypertension: Secondary | ICD-10-CM

## 2013-11-06 MED ORDER — LOSARTAN POTASSIUM 100 MG PO TABS: 100.0000 mg | ORAL_TABLET | Freq: Every day | ORAL | Status: DC

## 2013-11-06 NOTE — Assessment & Plan Note (Signed)
Blood pressure has been running high. We have suggested she closely monitor her blood pressure at home. If he continues to run high with diastolic more than 90 on a regular basis, we suggested she increase the losartan up to 100 mg daily

## 2013-11-06 NOTE — Patient Instructions (Signed)
You are doing well. Please monitor your blood pressure If the bottom number runs consistently >90, Increase the losartan to 100 mg daily  Low carb diet, exercise  Please call us if you have new issues that need to be addressed before your next appt.  Your physician wants you to follow-up in: 12 months.  You will receive a reminder letter in the mail two months in advance. If you don't receive a letter, please call our office to schedule the follow-up appointment.

## 2013-11-06 NOTE — Assessment & Plan Note (Signed)
Cholesterol has been climbing. Suggested she restart her exercise program. If this continues to climb, we'll need to do a full Vytorin pill 10/40 mg daily

## 2013-11-06 NOTE — Progress Notes (Signed)
Patient ID: Carla [name], female    DOB: April 26, 1961, 52 y.o.   MRN: [id number]  HPI Comments: Carla [name] is a 52 -year-old  woman with a history of baseline bradycardia, hyperlipidemia, hypertension with strong family history of coronary artery disease who presents for routine followup.     Overall Carla [name] states that she is doing well. Her weight continues slowly climbing. She has not been exercising.  Recent stressors at home. Lost her father in September 2014.  She denies any chest pain, shortness of breath. Otherwise she is active.    She has been able to tolerate her cholesterol medication with no difficulty and has been taking Vytorin 5/20 mg daily. She has not been monitoring her blood pressure at home. Blood pressure in doctor's offices has been elevated. Change from lisinopril to losartan Overall she has no complaints.    Cholesterol has increased from 160 range to 170 range, now up to 190, LDL 109, HDL 47 EKG shows normal sinus rhythm with rate 59 beats per minute, no significant ST or T wave changes      Outpatient Encounter Prescriptions as of 11/06/2013  Medication Sig  . aspirin 81 MG tablet Take 81 mg by mouth daily.    . calcium carbonate (OS-CAL) 600 MG TABS Take 600 mg by mouth daily.    Marland Kitchen escitalopram (LEXAPRO) 10 MG tablet Take 10 mg by mouth daily.  Marland Kitchen estradiol (ESTRACE) 1 MG tablet Take 1 mg by mouth daily.   Marland Kitchen ezetimibe-simvastatin (VYTORIN) 10-40 MG per tablet Take 1/2-1 tablet by mouth everyday.  . fluticasone (FLONASE) 50 MCG/ACT nasal spray Place 1 spray into both nostrils daily.   Marland Kitchen losartan (COZAAR) 50 MG tablet Take 50 mg by mouth daily.  . Multiple Vitamin (MULTIVITAMIN) tablet Take 1 tablet by mouth daily.    . Omega-3 Fatty Acids (FISH OIL) 1200 MG CAPS Take 1 capsule by mouth daily.    . vitamin E 400 UNIT capsule Take 400 Units by mouth daily.     Review of Systems  Constitutional: Negative.   HENT: Negative.   Eyes: Negative.    Respiratory: Negative.   Cardiovascular: Negative.   Gastrointestinal: Negative.   Endocrine: Negative.   Musculoskeletal: Negative.   Skin: Negative.   Allergic/Immunologic: Negative.   Neurological: Negative.   Hematological: Negative.   Psychiatric/Behavioral: Negative.   All other systems reviewed and are negative.    BP 138/98  Pulse 59  Ht 5\' 3"  (1.6 m)  Wt 169 lb 4 oz (76.771 kg)  BMI 29.99 kg/m2  Physical Exam  Nursing note and vitals reviewed. Constitutional: She is oriented to person, place, and time. She appears well-developed and well-nourished.  HENT:  Head: Normocephalic.  Nose: Nose normal.  Mouth/Throat: Oropharynx is clear and moist.  Eyes: Conjunctivae are normal. Pupils are equal, round, and reactive to light.  Neck: Normal range of motion. Neck supple. No JVD present.  Cardiovascular: Normal rate, regular rhythm, S1 normal, S2 normal, normal heart sounds and intact distal pulses.  Exam reveals no gallop and no friction rub.   No murmur heard. Pulmonary/Chest: Effort normal and breath sounds normal. No respiratory distress. She has no wheezes. She has no rales. She exhibits no tenderness.  Abdominal: Soft. Bowel sounds are normal. She exhibits no distension. There is no tenderness.  Musculoskeletal: Normal range of motion. She exhibits no edema and no tenderness.  Lymphadenopathy:    She has no cervical adenopathy.  Neurological: She is alert and oriented to  person, place, and time. Coordination normal.  Skin: Skin is warm and dry. No rash noted. No erythema.  Psychiatric: She has a normal mood and affect. Her behavior is normal. Judgment and thought content normal.    Assessment and Plan

## 2014-02-05 ENCOUNTER — Telehealth: Payer: Self-pay | Admitting: *Deleted

## 2014-02-05 NOTE — Telephone Encounter (Signed)
Please call she has a question on the Vytorin. Thanks

## 2014-02-06 NOTE — Telephone Encounter (Signed)
Patient notified samples available for vytorin.

## 2014-02-06 NOTE — Telephone Encounter (Signed)
LMOM to call back regarding Vytorin.

## 2014-02-06 NOTE — Telephone Encounter (Signed)
Pt needs samples.

## 2014-03-06 ENCOUNTER — Other Ambulatory Visit: Payer: Self-pay | Admitting: *Deleted

## 2014-03-06 MED ORDER — EZETIMIBE-SIMVASTATIN 10-40 MG PO TABS: ORAL_TABLET | ORAL | Status: DC

## 2014-03-06 NOTE — Telephone Encounter (Signed)
Requested Prescriptions   Signed Prescriptions Disp Refills  . ezetimibe-simvastatin (VYTORIN) 10-40 MG per tablet 90 tablet 3    Sig: Take 1/2-1 tablet by mouth everyday.    Authorizing Provider: Minna Merritts    Ordering User: Britt Bottom

## 2014-03-06 NOTE — Telephone Encounter (Signed)
90 day supply

## 2014-03-07 ENCOUNTER — Telehealth: Payer: Self-pay | Admitting: *Deleted

## 2014-03-07 NOTE — Telephone Encounter (Signed)
Pt requires prior authorization for Vytorin 10-40 mg tablet.  Awaiting approval

## 2014-03-08 ENCOUNTER — Telehealth: Payer: Self-pay | Admitting: *Deleted

## 2014-03-08 NOTE — Telephone Encounter (Signed)
Pt needs documentation that she has tried and failed lipitor before her ins will cover vytorin.

## 2014-03-08 NOTE — Telephone Encounter (Signed)
Patient has been denied coverage for Vytorin 10-40 mg tablet. Pt must have at least had 30 day trail and failure of Atorvastatin (Lipitor). Please advise.

## 2014-03-08 NOTE — Telephone Encounter (Signed)
Can we see if she has tried lipitor If not, for insurance reasons, we could try it. Would start 40 mg cut in 1/2 Up to a full pill in a few weeks

## 2014-03-08 NOTE — Telephone Encounter (Signed)
Please see below.

## 2014-03-08 NOTE — Telephone Encounter (Signed)
Patient is aware that she was denied coverage for Vytorin 10-40 mg Awaiting Dr. Donivan Scull response to see if he would like to change medication. Placed samples of Vytorin 10-40 mg until patient is notified of change.

## 2014-03-08 NOTE — Telephone Encounter (Signed)
Please call patient. She wants to know if Vytorin has been approved. She needs samples to hold her over until insurance has approved this Rx.

## 2014-03-09 NOTE — Telephone Encounter (Signed)
Left message for pt to call back  °

## 2014-03-09 NOTE — Telephone Encounter (Signed)
We could try crestor 10 to 20 mg daily Not generic   We could try lovastatin 40 mg daily (start a 1/2 pill, titrate upwards) This is weaker and may need 80 mg daily

## 2014-03-09 NOTE — Telephone Encounter (Signed)
Spoke w/ pt.  She reports that her father had a great deal of problems w/ lipitor and she adamantly does not want to take it. She would like to try something completely different from lipitor and vytorin that ins would possibly cover. Advised her that ins will cover vytorin if she attempts lipitor and that I would send in a small amount for her to try, but she refuses.  Please advise on an alternate med.  Thank you.

## 2014-03-14 NOTE — Telephone Encounter (Signed)
Spoke w/ pt. Advised her of Dr. Donivan Scull recommendations.   She was confused and reports that she previously took lisinopril, not lovastatin.  She would like to try this, as she recently switched to a new ins co and would like to try something generic. She would like to hold off on sending this to her pharmacy, as she has a good supply of vytorin samples at home.  She will call when she is ready to fill this rx.

## 2014-03-14 NOTE — Telephone Encounter (Signed)
Spoke w/ pt.  She states that she does not know anything about crestor. Reports that she is pretty sure that she has taken lovastatin in the past and has some at home.  Advised her that I do not see this listed as one of her past meds. She will check on this and call back w/ her preference.

## 2014-04-26 ENCOUNTER — Telehealth: Payer: Self-pay | Admitting: *Deleted

## 2014-04-26 MED ORDER — ATORVASTATIN CALCIUM 40 MG PO TABS: 40.0000 mg | ORAL_TABLET | Freq: Every day | ORAL | Status: DC

## 2014-04-26 NOTE — Telephone Encounter (Signed)
Left message for pt to call back  °

## 2014-04-26 NOTE — Telephone Encounter (Signed)
Spoke w/ pt.  She states that her ins will not pay for vytorin until she has tried and failed lipitor.  Advised her that Dr. Rockey Situ had advised her to try lipitor previously, but she refused.  She states that this was never an issue, as she is friends w/ Dr. Tami Ribas and has always received samples.  She states that he would like to try it.  Advised her that I am sending rx to [facility] for lipitor 40mg  1 PO QD #30, for pt to take 1/2 pill QD x 2 weeks and then titrate up to a whole pill QD. She is agreeable and will call w/ further questions or concerns.

## 2014-04-26 NOTE — Telephone Encounter (Signed)
Please call patient regarding Vytorin. She has several questions.

## 2014-05-21 ENCOUNTER — Telehealth: Payer: Self-pay

## 2014-05-21 NOTE — Telephone Encounter (Signed)
Pt called, states ever since Dr. Rockey Situ changed her medication, she has had "total lack of energy, example,walking up steps just is exhausting" and some chest discomfort".

## 2014-05-21 NOTE — Telephone Encounter (Signed)
Spoke w/ pt.  She states that she would like to be evaluated, as she feels that her BP meds need to be adjusted.  Offered pt to see Dr. Rockey Situ tomorrow at 11:00.  She will call if she cannot keep this appt.

## 2014-05-22 ENCOUNTER — Encounter: Payer: Self-pay | Admitting: Cardiovascular Disease

## 2014-05-22 ENCOUNTER — Ambulatory Visit (INDEPENDENT_AMBULATORY_CARE_PROVIDER_SITE_OTHER): Payer: BC Managed Care – PPO | Admitting: Cardiovascular Disease

## 2014-05-22 VITALS — BP 110/90 | HR 70 | Ht 64.0 in | Wt 172.5 lb

## 2014-05-22 DIAGNOSIS — R5383 Other fatigue: Secondary | ICD-10-CM

## 2014-05-22 DIAGNOSIS — R079 Chest pain, unspecified: Secondary | ICD-10-CM

## 2014-05-22 DIAGNOSIS — R0789 Other chest pain: Secondary | ICD-10-CM

## 2014-05-22 DIAGNOSIS — I10 Essential (primary) hypertension: Secondary | ICD-10-CM

## 2014-05-22 DIAGNOSIS — R5381 Other malaise: Secondary | ICD-10-CM

## 2014-05-22 DIAGNOSIS — E785 Hyperlipidemia, unspecified: Secondary | ICD-10-CM

## 2014-05-22 NOTE — Assessment & Plan Note (Signed)
Blood pressure is well controlled on today's visit. No changes made to the medications. 

## 2014-05-22 NOTE — Assessment & Plan Note (Signed)
Atypical chest tightness described as a tingling. Likely from stress or other etiology. We did offer routine treadmill study. She will try exercise first and monitor her symptoms.

## 2014-05-22 NOTE — Patient Instructions (Signed)
You are doing well. No medication changes were made.  Please call us if you have new issues that need to be addressed before your next appt.  Your physician wants you to follow-up in: 12 months.  You will receive a reminder letter in the mail two months in advance. If you don't receive a letter, please call our office to schedule the follow-up appointment. 

## 2014-05-22 NOTE — Progress Notes (Signed)
Patient ID: Carla [name], female    DOB: 1961-05-24, 53 y.o.   MRN: [id number]  HPI Comments: Carla [name] is a 53 -year-old  woman with a history of baseline bradycardia, hyperlipidemia, hypertension with strong family history of coronary artery disease who presents for routine followup.     Overall Carla [name] states that she is doing well. Her weight continues to be slightly elevated. She has not been exercising.  Recent stressors at home. Lost her father in September 2014. Still dealing with his estate. She does report some numbness in her chest area that typically presents at rest. Denies having any significant symptoms associated with exertion. She is active in her garden, takes care of 50 chickens. Typically does not have symptoms when she is active. Recently changed from Vytorin 5/20 mg daily, now taking generic Lipitor 20 mg daily for insurance reasons  Cholesterol has increased over the past several years, now 190 recently on the generic Lipitor. This is the same as Vytorin 5/20 EKG shows normal sinus rhythm with rate 70 beats per minute, no significant ST or T wave changes      Outpatient Encounter Prescriptions as of 05/22/2014  Medication Sig  . aspirin 81 MG tablet Take 81 mg by mouth daily.    Marland Kitchen atorvastatin (LIPITOR) 40 MG tablet Take 1 tablet (40 mg total) by mouth daily.  . calcium carbonate (OS-CAL) 600 MG TABS Take 600 mg by mouth daily.    Marland Kitchen escitalopram (LEXAPRO) 10 MG tablet Take 10 mg by mouth daily.  Marland Kitchen estradiol (ESTRACE) 1 MG tablet Take 1 mg by mouth daily.   Marland Kitchen losartan (COZAAR) 100 MG tablet Take 1 tablet (100 mg total) by mouth daily.  . Multiple Vitamin (MULTIVITAMIN) tablet Take 1 tablet by mouth daily.    . Omega-3 Fatty Acids (FISH OIL) 1200 MG CAPS Take 1 capsule by mouth daily.    . vitamin E 400 UNIT capsule Take 400 Units by mouth daily.      Review of Systems  Constitutional: Negative.   HENT: Negative.   Eyes: Negative.   Respiratory:  Positive for chest tightness.   Cardiovascular: Negative.   Gastrointestinal: Negative.   Endocrine: Negative.   Musculoskeletal: Negative.   Skin: Negative.   Allergic/Immunologic: Negative.   Neurological: Negative.   Hematological: Negative.   Psychiatric/Behavioral: Negative.   All other systems reviewed and are negative.   BP 110/90  Pulse 70  Ht 5\' 4"  (1.626 m)  Wt 172 lb 8 oz (78.245 kg)  BMI 29.59 kg/m2  Physical Exam  Nursing note and vitals reviewed. Constitutional: She is oriented to person, place, and time. She appears well-developed and well-nourished.  HENT:  Head: Normocephalic.  Nose: Nose normal.  Mouth/Throat: Oropharynx is clear and moist.  Eyes: Conjunctivae are normal. Pupils are equal, round, and reactive to light.  Neck: Normal range of motion. Neck supple. No JVD present.  Cardiovascular: Normal rate, regular rhythm, S1 normal, S2 normal, normal heart sounds and intact distal pulses.  Exam reveals no gallop and no friction rub.   No murmur heard. Pulmonary/Chest: Effort normal and breath sounds normal. No respiratory distress. She has no wheezes. She has no rales. She exhibits no tenderness.  Abdominal: Soft. Bowel sounds are normal. She exhibits no distension. There is no tenderness.  Musculoskeletal: Normal range of motion. She exhibits no edema and no tenderness.  Lymphadenopathy:    She has no cervical adenopathy.  Neurological: She is alert and oriented to person, place,  and time. Coordination normal.  Skin: Skin is warm and dry. No rash noted. No erythema.  Psychiatric: She has a normal mood and affect. Her behavior is normal. Judgment and thought content normal.    Assessment and Plan

## 2014-05-22 NOTE — Assessment & Plan Note (Signed)
Encouraged her to stay generic Lipitor. Also encouraged her to start a regular walking program for weight loss.

## 2014-06-12 ENCOUNTER — Other Ambulatory Visit: Payer: Self-pay | Admitting: Obstetrics and Gynecology

## 2014-06-13 LAB — CYTOLOGY - PAP

## 2014-06-14 ENCOUNTER — Other Ambulatory Visit: Payer: Self-pay | Admitting: Obstetrics and Gynecology

## 2014-06-14 DIAGNOSIS — R928 Other abnormal and inconclusive findings on diagnostic imaging of breast: Secondary | ICD-10-CM

## 2014-06-19 ENCOUNTER — Ambulatory Visit
Admission: RE | Admit: 2014-06-19 | Discharge: 2014-06-19 | Disposition: A | Discharge status: Home / Self Care | Payer: BC Managed Care – PPO | Source: Ambulatory Visit | Attending: Obstetrics and Gynecology | Admitting: Obstetrics and Gynecology

## 2014-06-19 DIAGNOSIS — R928 Other abnormal and inconclusive findings on diagnostic imaging of breast: Secondary | ICD-10-CM

## 2014-11-27 ENCOUNTER — Other Ambulatory Visit: Payer: Self-pay | Admitting: Cardiovascular Disease

## 2014-12-12 ENCOUNTER — Ambulatory Visit: Payer: Self-pay | Admitting: Cardiovascular Disease

## 2014-12-28 ENCOUNTER — Encounter: Payer: Self-pay | Admitting: Cardiovascular Disease

## 2014-12-28 ENCOUNTER — Ambulatory Visit (INDEPENDENT_AMBULATORY_CARE_PROVIDER_SITE_OTHER): Payer: BLUE CROSS/BLUE SHIELD | Admitting: Cardiovascular Disease

## 2014-12-28 VITALS — BP 140/88 | HR 59 | Ht 63.0 in | Wt 180.5 lb

## 2014-12-28 DIAGNOSIS — E785 Hyperlipidemia, unspecified: Secondary | ICD-10-CM

## 2014-12-28 DIAGNOSIS — I10 Essential (primary) hypertension: Secondary | ICD-10-CM

## 2014-12-28 MED ORDER — LOSARTAN POTASSIUM 100 MG PO TABS: 100.0000 mg | ORAL_TABLET | Freq: Every day | ORAL | Status: DC

## 2014-12-28 MED ORDER — ATORVASTATIN CALCIUM 40 MG PO TABS: 40.0000 mg | ORAL_TABLET | Freq: Every day | ORAL | Status: DC

## 2014-12-28 NOTE — Assessment & Plan Note (Signed)
Blood pressure borderline elevated. Recommended she closely monitor this at home

## 2014-12-28 NOTE — Progress Notes (Signed)
Patient ID: Carla [name], female    DOB: [birth year], 54 y.o.   MRN: [id number]  HPI Comments: Carla [name] is a 54 -year-old  woman with a history of baseline bradycardia, hyperlipidemia, hypertension with strong family history of coronary artery disease who presents for routine followup of her hyperlipidemia.  In follow-up today, weight continues to run high. She has not been exercising on a regular basis. Continued stressors at home. She is tolerating Lipitor 20 mg daily. Most recent cholesterol 194  Lost her father in September 2014. Still dealing with his estate. No significant chest pain with exertion. Overall active, takes care of 50 chickens Previously was on Vytorin 5/20 mg daily, now taking generic Lipitor 20 mg daily for insurance reasons  EKG on today's visit shows normal sinus rhythm with rate 55 bpm, no significant ST or T-wave changes      No Known Allergies  Outpatient Encounter Prescriptions as of 12/28/2014  Medication Sig  . aspirin 81 MG tablet Take 81 mg by mouth daily.    Marland Kitchen atorvastatin (LIPITOR) 40 MG tablet Take 1 tablet (40 mg total) by mouth daily.  . calcium carbonate (OS-CAL) 600 MG TABS Take 600 mg by mouth daily.    Marland Kitchen escitalopram (LEXAPRO) 10 MG tablet Take 5 mg by mouth daily.   Marland Kitchen estradiol (ESTRACE) 1 MG tablet Take 1 mg by mouth daily.   Marland Kitchen losartan (COZAAR) 100 MG tablet TAKE ONE (1) TABLET BY MOUTH EVERY DAY  . Multiple Vitamin (MULTIVITAMIN) tablet Take 1 tablet by mouth daily.    . Omega-3 Fatty Acids (FISH OIL) 1200 MG CAPS Take 1 capsule by mouth daily.    . vitamin E 400 UNIT capsule Take 400 Units by mouth daily.      Past Medical History  Diagnosis Date  . HLD (hyperlipidemia)   . HTN (hypertension)     Past Surgical History  Procedure Laterality Date  . Hysterctomy      Social History  reports that she has never smoked. She does not have any smokeless tobacco history on file. She reports that she drinks about 0.5 oz of alcohol per  week. She reports that she does not use illicit drugs.  Family History Family history is unknown by patient.  Review of Systems  Constitutional: Negative.   Respiratory: Negative.   Cardiovascular: Negative.   Gastrointestinal: Negative.   Musculoskeletal: Negative.   Skin: Negative.   Neurological: Negative.   Hematological: Negative.   Psychiatric/Behavioral: Negative.   All other systems reviewed and are negative.   BP 140/88 mmHg  Pulse 59  Ht 5\' 3"  (1.6 m)  Wt 180 lb 8 oz (81.874 kg)  BMI 31.98 kg/m2  Physical Exam  Constitutional: She is oriented to person, place, and time. She appears well-developed and well-nourished.  HENT:  Head: Normocephalic.  Nose: Nose normal.  Mouth/Throat: Oropharynx is clear and moist.  Eyes: Conjunctivae are normal. Pupils are equal, round, and reactive to light.  Neck: Normal range of motion. Neck supple. No JVD present.  Cardiovascular: Normal rate, regular rhythm, S1 normal, S2 normal, normal heart sounds and intact distal pulses.  Exam reveals no gallop and no friction rub.   No murmur heard. Pulmonary/Chest: Effort normal and breath sounds normal. No respiratory distress. She has no wheezes. She has no rales. She exhibits no tenderness.  Abdominal: Soft. Bowel sounds are normal. She exhibits no distension. There is no tenderness.  Musculoskeletal: Normal range of motion. She exhibits no edema or tenderness.  Lymphadenopathy:    She has no cervical adenopathy.  Neurological: She is alert and oriented to person, place, and time. Coordination normal.  Skin: Skin is warm and dry. No rash noted. No erythema.  Psychiatric: She has a normal mood and affect. Her behavior is normal. Judgment and thought content normal.    Assessment and Plan  Nursing note and vitals reviewed.

## 2014-12-28 NOTE — Assessment & Plan Note (Signed)
She does not want more cholesterol medication at this time and will stay on Lipitor 20 mg daily. Stressed the importance of diet and exercise

## 2014-12-28 NOTE — Patient Instructions (Addendum)
You are doing well. No medication changes were made.  Please let me know if you would like a CT coronary calcium score  (done in [place], $150)  Please call us if you have new issues that need to be addressed before your next appt.  Your physician wants you to follow-up in: 12 months.  You will receive a reminder letter in the mail two months in advance. If you don't receive a letter, please call our office to schedule the follow-up appointment.

## 2015-02-18 ENCOUNTER — Telehealth: Payer: Self-pay | Admitting: *Deleted

## 2015-02-18 NOTE — Telephone Encounter (Signed)
Pt calling stating she stopped Lipitor. She would like to know what can she take other than that. For it was causing, joint pains, legs would hurt badly.  And since Friday she has stopped it and it seems to gotten better.  Please advise.

## 2015-02-18 NOTE — Telephone Encounter (Signed)
Would try Crestor 5 mg daily for one month then up to 10 mg daily Recheck cholesterol/LFTs in 3 months time Come in for samples, coupon Would give prescription for 10 mg which can be cut in half to start

## 2015-02-19 MED ORDER — ROSUVASTATIN CALCIUM 10 MG PO TABS: 10.0000 mg | ORAL_TABLET | Freq: Every day | ORAL | Status: DC

## 2015-02-19 NOTE — Telephone Encounter (Signed)
Spoke w/ pt.  Advised her of Dr. Donivan Scull recommendation.  She is agreeable and asks for samples to be left at the front desk for her to pick up at her convenience.

## 2015-02-20 ENCOUNTER — Telehealth: Payer: Self-pay | Admitting: *Deleted

## 2015-02-20 NOTE — Telephone Encounter (Signed)
Pt required PA for Crestor 10 mg.  Pt has been approved for Crestor 10 mg from 01/21/15-02/20/16.

## 2015-02-20 NOTE — Telephone Encounter (Signed)
Error prev. Telephone note.  Pt approved for Crestor 10 mg 02/20/2015-02/20/20

## 2015-04-02 ENCOUNTER — Telehealth: Payer: Self-pay | Admitting: *Deleted

## 2015-04-02 NOTE — Telephone Encounter (Signed)
Pt calling asking for samples on Crestor 10 mg.  She would like to pick them up soon Please advise.

## 2015-04-02 NOTE — Telephone Encounter (Signed)
Samples available of crestor 20 mg; patient instructed to cut tablet in half. Patient understands instructions.

## 2015-04-23 ENCOUNTER — Telehealth: Payer: Self-pay | Admitting: *Deleted

## 2015-04-23 NOTE — Telephone Encounter (Signed)
Pt is calling stating she is not feeling well, and she is having swelling.  Her hands and pretty much all over.  Just very weak feeling, lack of energy.  Pt states she is going to call PCP after I asked about it.  No need to call, she states she will call if they need [place].

## 2015-04-24 ENCOUNTER — Other Ambulatory Visit: Payer: Self-pay | Admitting: *Deleted

## 2015-04-24 ENCOUNTER — Telehealth: Payer: Self-pay | Admitting: *Deleted

## 2015-04-24 DIAGNOSIS — E785 Hyperlipidemia, unspecified: Secondary | ICD-10-CM

## 2015-04-24 DIAGNOSIS — I10 Essential (primary) hypertension: Secondary | ICD-10-CM

## 2015-04-24 NOTE — Telephone Encounter (Signed)
Pt has been scheduled for labs tomorrow 04/25/2015.

## 2015-04-24 NOTE — Telephone Encounter (Signed)
Pt s asking for more samples of Crestor.  Pt is also stating Dr Rockey Situ said once patient has taken Crestor for a few months that she needed to come in for labs.  She needs to know is this still the plan. Please call and let her know.

## 2015-04-24 NOTE — Telephone Encounter (Signed)
Currently do not have crestor samples we will contact drug rep.

## 2015-04-25 ENCOUNTER — Other Ambulatory Visit (INDEPENDENT_AMBULATORY_CARE_PROVIDER_SITE_OTHER): Payer: BLUE CROSS/BLUE SHIELD

## 2015-04-25 ENCOUNTER — Other Ambulatory Visit: Payer: BLUE CROSS/BLUE SHIELD

## 2015-04-25 ENCOUNTER — Encounter: Payer: Self-pay | Admitting: Cardiovascular Disease

## 2015-04-25 ENCOUNTER — Ambulatory Visit: Payer: Self-pay | Admitting: Family Medicine

## 2015-04-25 ENCOUNTER — Ambulatory Visit (INDEPENDENT_AMBULATORY_CARE_PROVIDER_SITE_OTHER): Payer: BLUE CROSS/BLUE SHIELD | Admitting: Cardiovascular Disease

## 2015-04-25 VITALS — BP 132/90 | HR 61 | Ht 63.0 in | Wt 182.5 lb

## 2015-04-25 DIAGNOSIS — E785 Hyperlipidemia, unspecified: Secondary | ICD-10-CM | POA: Diagnosis not present

## 2015-04-25 DIAGNOSIS — R0602 Shortness of breath: Secondary | ICD-10-CM | POA: Insufficient documentation

## 2015-04-25 DIAGNOSIS — R531 Weakness: Secondary | ICD-10-CM | POA: Diagnosis not present

## 2015-04-25 DIAGNOSIS — I10 Essential (primary) hypertension: Secondary | ICD-10-CM

## 2015-04-25 DIAGNOSIS — Z8249 Family history of ischemic heart disease and other diseases of the circulatory system: Secondary | ICD-10-CM

## 2015-04-25 DIAGNOSIS — R5383 Other fatigue: Secondary | ICD-10-CM

## 2015-04-25 MED ORDER — HYDROCHLOROTHIAZIDE 25 MG PO TABS: 25.0000 mg | ORAL_TABLET | Freq: Every day | ORAL | Status: DC | PRN

## 2015-04-25 NOTE — Assessment & Plan Note (Signed)
She reports having leg discomfort laterally below the knees. Suggested she hold the Crestor for now She has been reading articles in the paper and is questioning the benefit of being on a statin

## 2015-04-25 NOTE — Telephone Encounter (Signed)
Pt presented to the office for labs and wanted to discuss new onset swelling. Pt has b/l edema in ankles and hands, states that her rings are uncomfortable to wear. Pt was started on Crestor on 02/19/15 and has noticed some cramping in her legs.  She c/o SOBOE and decreased energy, she is not able to do the same amount of activities that she is accustomed to. She verbalizes frustration that she called our office yesterday and was told to call her PCP. She has an appt today w/ PCP @ 1:30 but would like to know if she can cancel this.  Please advise.  Thank you.

## 2015-04-25 NOTE — Patient Instructions (Addendum)
Please hold the crestor  Start HCTZ as needed for blood pressure and swelling  We will order a CT coronary calcium score  Liver and lipids in october  Please call us if you have new issues that need to be addressed before your next appt.  Your physician wants you to follow-up in: 6 months.  You will receive a reminder letter in the mail two months in advance. If you don't receive a letter, please call our office to schedule the follow-up appointment.

## 2015-04-25 NOTE — Assessment & Plan Note (Signed)
We have recommended she start HCTZ in the morning as needed for leg swelling and hand swelling Continue on her losartan

## 2015-04-25 NOTE — Assessment & Plan Note (Signed)
[place] and reports having shortness of breath with hills and stairs. Suspect this could be secondary to deconditioning Recommended she start a regular exercise program

## 2015-04-25 NOTE — Telephone Encounter (Signed)
Spoke w/ Dr. Rockey Situ.  He recommends pt come in for evaluation since sx are new onset.  Pt sched to come in this afternoon @ 2:30. Pt is agreeable and appreciative of the call.

## 2015-04-25 NOTE — Progress Notes (Signed)
Patient ID: Carla [name], female    DOB: [birth year], 54 y.o.   MRN: [id number]  HPI Comments: Carla [name] is a 54 -year-old  woman with a history of baseline bradycardia, hyperlipidemia, hypertension with strong family history of coronary artery disease who presents for routine followup of her hyperlipidemia.  In follow-up today, she reports having significant leg discomfort below the knees bilaterally. Also reports having significant swelling of her legs and hands in the past several months. She is very uncomfortable. Blood pressure has been running high She's been drinking lots of fluids, trying to lose weight, trying to eat better Recently went to [facility] and had shortness of breath with hills and stairs  EKG on today's visit shows normal sinus rhythm with rate 61 bpm, no significant ST or T-wave changes  Previously on Vytorin 5/20 milligrams daily, change to Lipitor for cost reasons Change to Crestor 10 mg daily  Other past medical history  Lost her father in September 2014. Still dealing with his estate. Overall active, takes care of 50 chickens   No Known Allergies  Outpatient Encounter Prescriptions as of 04/25/2015  Medication Sig  . aspirin 81 MG tablet Take 81 mg by mouth daily.    . calcium carbonate (OS-CAL) 600 MG TABS Take 600 mg by mouth daily.    Marland Kitchen estradiol (ESTRACE) 1 MG tablet Take 1 mg by mouth daily.   Marland Kitchen losartan (COZAAR) 100 MG tablet Take 1 tablet (100 mg total) by mouth daily.  . Multiple Vitamin (MULTIVITAMIN) tablet Take 1 tablet by mouth daily.    . Omega-3 Fatty Acids (FISH OIL) 1200 MG CAPS Take 1 capsule by mouth daily.    . vitamin E 400 UNIT capsule Take 400 Units by mouth daily.    . [DISCONTINUED] rosuvastatin (CRESTOR) 10 MG tablet Take 1 tablet (10 mg total) by mouth daily.  . hydrochlorothiazide (HYDRODIURIL) 25 MG tablet Take 1 tablet (25 mg total) by mouth daily as needed.  . [DISCONTINUED] escitalopram (LEXAPRO) 10 MG tablet Take 5 mg  by mouth daily.    No facility-administered encounter medications on file as of 04/25/2015.    Past Medical History  Diagnosis Date  . HLD (hyperlipidemia)   . HTN (hypertension)     Past Surgical History  Procedure Laterality Date  . Hysterctomy      Social History  reports that she has never smoked. She does not have any smokeless tobacco history on file. She reports that she drinks about 0.5 oz of alcohol per week. She reports that she does not use illicit drugs.  Family History Family history is unknown by patient.  Review of Systems  Constitutional: Negative.   Respiratory: Positive for shortness of breath.   Cardiovascular: Positive for leg swelling.  Gastrointestinal: Negative.   Musculoskeletal: Negative.   Skin: Negative.   Neurological: Negative.   Hematological: Negative.   Psychiatric/Behavioral: Negative.   All other systems reviewed and are negative.   BP 132/90 mmHg  Pulse 61  Ht 5\' 3"  (1.6 m)  Wt 182 lb 8 oz (82.781 kg)  BMI 32.34 kg/m2  Physical Exam  Constitutional: She is oriented to person, place, and time. She appears well-developed and well-nourished.  HENT:  Head: Normocephalic.  Nose: Nose normal.  Mouth/Throat: Oropharynx is clear and moist.  Eyes: Conjunctivae are normal. Pupils are equal, round, and reactive to light.  Neck: Normal range of motion. Neck supple. No JVD present.  Cardiovascular: Normal rate, regular rhythm, S1 normal, S2 normal,  normal heart sounds and intact distal pulses.  Exam reveals no gallop and no friction rub.   No murmur heard. Pulmonary/Chest: Effort normal and breath sounds normal. No respiratory distress. She has no wheezes. She has no rales. She exhibits no tenderness.  Abdominal: Soft. Bowel sounds are normal. She exhibits no distension. There is no tenderness.  Musculoskeletal: Normal range of motion. She exhibits no edema or tenderness.  Lymphadenopathy:    She has no cervical adenopathy.  Neurological:  She is alert and oriented to person, place, and time. Coordination normal.  Skin: Skin is warm and dry. No rash noted. No erythema.  Psychiatric: She has a normal mood and affect. Her behavior is normal. Judgment and thought content normal.    Assessment and Plan  Nursing note and vitals reviewed.

## 2015-04-26 LAB — HEPATIC FUNCTION PANEL
ALBUMIN: 4.4 g/dL (ref 3.5–5.5)
ALK PHOS: 27 IU/L — AB (ref 39–117)
ALT: 18 IU/L (ref 0–32)
AST: 24 IU/L (ref 0–40)
BILIRUBIN TOTAL: 0.5 mg/dL (ref 0.0–1.2)
BILIRUBIN, DIRECT: 0.1 mg/dL (ref 0.00–0.40)
TOTAL PROTEIN: 7 g/dL (ref 6.0–8.5)

## 2015-04-26 LAB — LIPID PANEL
Chol/HDL Ratio: 3.6 ratio units (ref 0.0–4.4)
Cholesterol, Total: 179 mg/dL (ref 100–199)
HDL: 50 mg/dL (ref >39)
LDL Calculated: 93 mg/dL (ref 0–99)
Triglycerides: 178 mg/dL — ABNORMAL HIGH (ref 0–149)
VLDL CHOLESTEROL CAL: 36 mg/dL (ref 5–40)

## 2015-05-06 ENCOUNTER — Telehealth: Payer: Self-pay | Admitting: *Deleted

## 2015-05-06 NOTE — Telephone Encounter (Signed)
Left message for pt to call back w/ date & time she would like this set up for.

## 2015-05-06 NOTE — Telephone Encounter (Signed)
Pt calling asking nurse to call her so we can help her schedule her a calcium score.  Please call when we can

## 2015-05-06 NOTE — Telephone Encounter (Signed)
Left message for pt that CT Cardiac Score is sched for 05/10/15 @ 1:00, for her to arrive @ 12:45 and there is a one time fee of $150.00 due at the time of the procedure.

## 2015-05-06 NOTE — Telephone Encounter (Signed)
Any day after 12 pm. For she works in [employer].

## 2015-05-10 ENCOUNTER — Ambulatory Visit (INDEPENDENT_AMBULATORY_CARE_PROVIDER_SITE_OTHER)
Admission: RE | Admit: 2015-05-10 | Discharge: 2015-05-10 | Disposition: A | Discharge status: Home / Self Care | Payer: Self-pay | Source: Ambulatory Visit | Attending: Cardiovascular Disease | Admitting: Cardiovascular Disease

## 2015-05-10 DIAGNOSIS — Z8249 Family history of ischemic heart disease and other diseases of the circulatory system: Secondary | ICD-10-CM

## 2015-06-25 ENCOUNTER — Other Ambulatory Visit: Payer: Self-pay | Admitting: Obstetrics and Gynecology

## 2015-06-26 LAB — CYTOLOGY - PAP

## 2015-10-30 ENCOUNTER — Ambulatory Visit (INDEPENDENT_AMBULATORY_CARE_PROVIDER_SITE_OTHER): Payer: BLUE CROSS/BLUE SHIELD | Admitting: Cardiovascular Disease

## 2015-10-30 ENCOUNTER — Encounter: Payer: Self-pay | Admitting: Cardiovascular Disease

## 2015-10-30 VITALS — BP 130/80 | HR 58 | Ht 63.0 in | Wt 181.5 lb

## 2015-10-30 DIAGNOSIS — R0602 Shortness of breath: Secondary | ICD-10-CM | POA: Diagnosis not present

## 2015-10-30 DIAGNOSIS — I10 Essential (primary) hypertension: Secondary | ICD-10-CM | POA: Diagnosis not present

## 2015-10-30 DIAGNOSIS — R0789 Other chest pain: Secondary | ICD-10-CM

## 2015-10-30 DIAGNOSIS — E785 Hyperlipidemia, unspecified: Secondary | ICD-10-CM

## 2015-10-30 NOTE — Patient Instructions (Signed)
You are doing well. No medication changes were made.  Please call us if you have new issues that need to be addressed before your next appt.  Your physician wants you to follow-up in: 12 months.  You will receive a reminder letter in the mail two months in advance. If you don't receive a letter, please call our office to schedule the follow-up appointment. 

## 2015-10-30 NOTE — Progress Notes (Signed)
Patient ID: Carla [name], female    DOB: December 29, 1960, 54 y.o.   MRN: [id number]  HPI Comments: Carla [name] is a 54 -year-old  woman with a history of baseline bradycardia, hyperlipidemia, hypertension with strong family history of coronary artery disease who presents for routine followup of her hyperlipidemia.  In June 2016 she had CT coronary calcium score of 0 She was having difficulty tolerating Crestor secondary to leg myalgias She has stopped Crestor with improvement of her symptoms She was blood pressure is well controlled but does not check this on a frequent basis She is interested in coming off some of her medications Denies having any significant leg edema. Was previously on HCTZ and this did not seem to help her ankle swelling Was likely secondary to a long trip, on her feet, venous insufficiency Denies any worsening of her shortness of breath on exertion. Previously had symptoms on a trip Weight continues to run high, she would like to lose some weight, not doing regular exercise  EKG on today's visit shows normal sinus rhythm with rate 58 bpm, no significant ST or T-wave changes  Other past medical history Previously on Vytorin 5/20 milligrams daily, change to Lipitor for cost reasons Change to Crestor 10 mg daily Now off all statins secondary to myalgias  Other past medical history  Lost her father in September 2014. Still dealing with his estate. Overall active, takes care of 50 chickens   No Known Allergies  Outpatient Encounter Prescriptions as of 10/30/2015  Medication Sig  . aspirin 81 MG tablet Take 81 mg by mouth daily.    . calcium carbonate (OS-CAL) 600 MG TABS Take 600 mg by mouth daily.    Marland Kitchen estradiol (ESTRACE) 1 MG tablet Take 1 mg by mouth daily.   Marland Kitchen losartan (COZAAR) 100 MG tablet Take 1 tablet (100 mg total) by mouth daily.  . Multiple Vitamin (MULTIVITAMIN) tablet Take 1 tablet by mouth daily.    . Omega-3 Fatty Acids (FISH OIL) 1200 MG CAPS Take 1  capsule by mouth daily.    . vitamin E 400 UNIT capsule Take 400 Units by mouth daily.    . [DISCONTINUED] hydrochlorothiazide (HYDRODIURIL) 25 MG tablet Take 1 tablet (25 mg total) by mouth daily as needed. (Patient not taking: Reported on 10/30/2015)   No facility-administered encounter medications on file as of 10/30/2015.    Past Medical History  Diagnosis Date  . HLD (hyperlipidemia)   . HTN (hypertension)     Past Surgical History  Procedure Laterality Date  . Hysterctomy      Social History  reports that she has never smoked. She does not have any smokeless tobacco history on file. She reports that she does not drink alcohol or use illicit drugs.  Family History Family history is unknown by patient.  Review of Systems  Constitutional: Negative.   Respiratory: Negative.   Cardiovascular: Negative.   Gastrointestinal: Negative.   Musculoskeletal: Negative.   Skin: Negative.   Neurological: Negative.   Hematological: Negative.   Psychiatric/Behavioral: Negative.   All other systems reviewed and are negative.   BP 130/80 mmHg  Pulse 58  Ht 5\' 3"  (1.6 m)  Wt 181 lb 8 oz (82.328 kg)  BMI 32.16 kg/m2  Physical Exam  Constitutional: She is oriented to person, place, and time. She appears well-developed and well-nourished.  HENT:  Head: Normocephalic.  Nose: Nose normal.  Mouth/Throat: Oropharynx is clear and moist.  Eyes: Conjunctivae are normal. Pupils are equal, round,  and reactive to light.  Neck: Normal range of motion. Neck supple. No JVD present.  Cardiovascular: Normal rate, regular rhythm, S1 normal, S2 normal, normal heart sounds and intact distal pulses.  Exam reveals no gallop and no friction rub.   No murmur heard. Pulmonary/Chest: Effort normal and breath sounds normal. No respiratory distress. She has no wheezes. She has no rales. She exhibits no tenderness.  Abdominal: Soft. Bowel sounds are normal. She exhibits no distension. There is no tenderness.   Musculoskeletal: Normal range of motion. She exhibits no edema or tenderness.  Lymphadenopathy:    She has no cervical adenopathy.  Neurological: She is alert and oriented to person, place, and time. Coordination normal.  Skin: Skin is warm and dry. No rash noted. No erythema.  Psychiatric: She has a normal mood and affect. Her behavior is normal. Judgment and thought content normal.    Assessment and Plan  Nursing note and vitals reviewed.

## 2015-10-30 NOTE — Assessment & Plan Note (Signed)
Recommended a regular exercise program for weight loss, conditioning

## 2015-10-30 NOTE — Assessment & Plan Note (Signed)
She denies any recent episodes of chest pain

## 2015-10-30 NOTE — Assessment & Plan Note (Signed)
Recommended she have routine lab work with primary care next year Coronary calcium score of 0, currently not on a statin

## 2015-10-30 NOTE — Assessment & Plan Note (Signed)
Blood pressure is well controlled on today's visit. No changes made to the medications. 

## 2016-02-25 ENCOUNTER — Other Ambulatory Visit: Payer: Self-pay | Admitting: Cardiovascular Disease

## 2016-07-01 DIAGNOSIS — Z6831 Body mass index (BMI) 31.0-31.9, adult: Secondary | ICD-10-CM | POA: Diagnosis not present

## 2016-07-01 DIAGNOSIS — Z01419 Encounter for gynecological examination (general) (routine) without abnormal findings: Secondary | ICD-10-CM | POA: Diagnosis not present

## 2016-07-01 DIAGNOSIS — Z1231 Encounter for screening mammogram for malignant neoplasm of breast: Secondary | ICD-10-CM | POA: Diagnosis not present

## 2016-07-20 ENCOUNTER — Telehealth: Payer: Self-pay | Admitting: Cardiovascular Disease

## 2016-07-20 DIAGNOSIS — E048 Other specified nontoxic goiter: Secondary | ICD-10-CM | POA: Diagnosis not present

## 2016-07-20 DIAGNOSIS — E784 Other hyperlipidemia: Secondary | ICD-10-CM | POA: Diagnosis not present

## 2016-07-20 DIAGNOSIS — I10 Essential (primary) hypertension: Secondary | ICD-10-CM | POA: Diagnosis not present

## 2016-07-20 DIAGNOSIS — E221 Hyperprolactinemia: Secondary | ICD-10-CM | POA: Diagnosis not present

## 2016-07-20 DIAGNOSIS — Z1389 Encounter for screening for other disorder: Secondary | ICD-10-CM | POA: Diagnosis not present

## 2016-07-20 DIAGNOSIS — D352 Benign neoplasm of pituitary gland: Secondary | ICD-10-CM | POA: Diagnosis not present

## 2016-07-20 DIAGNOSIS — D126 Benign neoplasm of colon, unspecified: Secondary | ICD-10-CM | POA: Diagnosis not present

## 2016-07-20 DIAGNOSIS — J984 Other disorders of lung: Secondary | ICD-10-CM | POA: Diagnosis not present

## 2016-07-20 NOTE — Telephone Encounter (Signed)
Pt went to Endo Doctor, Dr Roque Cash and they did labs on patient Her cholesterol was high  And he is wanting to put her on some statin Just wanted to know what we thought about this

## 2016-07-22 NOTE — Telephone Encounter (Signed)
Labs are scanned in for your review.   TC 288!

## 2016-07-27 NOTE — Telephone Encounter (Signed)
She has tried several cholesterol medications, crestor, vytorin, etc, Had myalgias Certainly her choice to try again

## 2016-07-28 NOTE — Telephone Encounter (Signed)
Spoke w/ pt.  Advised her that I spoke w/ Dr. Rockey Situ.  Advised her that if she wants a pill for preventative measures, we can definitely retry a statin, but her recent CT calcium score was 0, so she has no buildup. Advised her to work on her diet & exercise and that if she loses wt, her TC will come down, as well. She verbalizes understanding and will try to work on this before her next ov in December. She is appreciative of the call.

## 2016-07-28 NOTE — Telephone Encounter (Signed)
Spoke w/ pt.  She is quite upset about the delay in response from our office.  She spoke w/ Dr. Alla German (he is a friend of hers) and he recommended that pt retry Crestor. She is willing to try anything, as she is very concerned about her TC #. She asks that we "put a rush on it", as her endocrinologist is "waiting on a decision".

## 2016-08-24 ENCOUNTER — Other Ambulatory Visit: Payer: Self-pay | Admitting: Cardiovascular Disease

## 2016-10-30 ENCOUNTER — Ambulatory Visit (INDEPENDENT_AMBULATORY_CARE_PROVIDER_SITE_OTHER): Payer: BLUE CROSS/BLUE SHIELD | Admitting: Cardiovascular Disease

## 2016-10-30 ENCOUNTER — Encounter: Payer: Self-pay | Admitting: Cardiovascular Disease

## 2016-10-30 VITALS — BP 157/81 | HR 56 | Ht 63.0 in | Wt 180.2 lb

## 2016-10-30 DIAGNOSIS — I10 Essential (primary) hypertension: Secondary | ICD-10-CM | POA: Diagnosis not present

## 2016-10-30 DIAGNOSIS — E7849 Other hyperlipidemia: Secondary | ICD-10-CM

## 2016-10-30 DIAGNOSIS — E784 Other hyperlipidemia: Secondary | ICD-10-CM

## 2016-10-30 MED ORDER — EZETIMIBE-SIMVASTATIN 10-40 MG PO TABS: 1.0000 | ORAL_TABLET | Freq: Every day | ORAL | 4 refills | Status: DC

## 2016-10-30 NOTE — Progress Notes (Signed)
Cardiology Office Note  Date:  10/30/2016   ID:  LINDLEY LANZI, DOB 12/04/1960, MRN [id number]  PCP:  Dicky Doe, MD   Chief Complaint  Patient presents with  . other    12 month follow up. Meds reviewed by the pt. verbally. "doing well."     HPI:  Ms. Carla [name] is a 55 -year-old  woman with a history of baseline bradycardia, hyperlipidemia, hypertension with strong family history of coronary artery disease who presents for routine followup of her hyperlipidemia.  In June 2016 she had CT coronary calcium score of 0 Previously on Vytorin, change to Lipitor secondary to cost Change to Crestor, reason unclear   was on 10 mg, developed myalgias, currently not on a statin  Recent lab work showing total cholesterol 288 Seen recently by endocrinology, recommended to restart a statin  Otherwise she feels well, continues to battle with her weight, no regular exercise program In general does not want to be on medication  Denies having any significant leg edema.  Was previously on HCTZ and this did not seem to help her ankle swelling Was likely secondary to a long trip, on her feet, venous insufficiency Denies any worsening of her shortness of breath on exertion.  EKG on today's visit shows normal sinus rhythm with rate 56 bpm, no significant ST or T-wave changes  Other past medical history Previously on Vytorin 5/20 milligrams daily, change to Lipitor for cost reasons Change to Crestor 10 mg daily Now off all statins secondary to myalgias  Other past medical history  Lost her father in September 2014. Still dealing with his estate. Overall active, takes care of 50 chickens   PMH:   has a past medical history of HLD (hyperlipidemia) and HTN (hypertension).  PSH:    Past Surgical History:  Procedure Laterality Date  . hysterctomy      Current Outpatient Prescriptions  Medication Sig Dispense Refill  . aspirin 81 MG tablet Take 81 mg by mouth daily.      . calcium  carbonate (OS-CAL) 600 MG TABS Take 600 mg by mouth daily.      Marland Kitchen estradiol (ESTRACE) 1 MG tablet Take 1 mg by mouth daily.     Marland Kitchen losartan (COZAAR) 100 MG tablet TAKE ONE (1) TABLET BY MOUTH EVERY DAY 90 tablet 3  . Multiple Vitamin (MULTIVITAMIN) tablet Take 1 tablet by mouth daily.      . Omega-3 Fatty Acids (FISH OIL) 1200 MG CAPS Take 1 capsule by mouth daily.      . vitamin E 400 UNIT capsule Take 400 Units by mouth daily.      Marland Kitchen ezetimibe-simvastatin (VYTORIN) 10-40 MG tablet Take 1 tablet by mouth daily. 90 tablet 4   No current facility-administered medications for this visit.      Allergies:   Patient has no known allergies.   Social History:  The patient  reports that she has never smoked. She has never used smokeless tobacco. She reports that she does not drink alcohol or use drugs.   Family History:   Family history is unknown by patient.    Review of Systems: Review of Systems  Constitutional: Negative.   Respiratory: Negative.   Cardiovascular: Negative.   Gastrointestinal: Negative.   Musculoskeletal: Negative.   Neurological: Negative.   Psychiatric/Behavioral: Negative.   All other systems reviewed and are negative.    PHYSICAL EXAM: VS:  BP (!) 157/81 (BP Location: Left Arm, Patient Position: Sitting, Cuff Size: Normal)  Pulse (!) 56   Ht 5\' 3"  (1.6 m)   Wt 180 lb 4 oz (81.8 kg)   BMI 31.93 kg/m  , BMI Body mass index is 31.93 kg/m. GEN: Well nourished, well developed, in no acute distress  HEENT: normal  Neck: no JVD, carotid bruits, or masses Cardiac: RRR; no murmurs, rubs, or gallops,no edema  Respiratory:  clear to auscultation bilaterally, normal work of breathing GI: soft, nontender, nondistended, + BS MS: no deformity or atrophy  Skin: warm and dry, no rash Neuro:  Strength and sensation are intact Psych: euthymic mood, full affect    Recent Labs: No results found for requested labs within last 8760 hours.    Lipid Panel Lab Results   Component Value Date   CHOL 179 04/25/2015   HDL 50 04/25/2015   LDLCALC 93 04/25/2015   TRIG 178 (H) 04/25/2015      Wt Readings from Last 3 Encounters:  10/30/16 180 lb 4 oz (81.8 kg)  10/30/15 181 lb 8 oz (82.3 kg)  04/25/15 182 lb 8 oz (82.8 kg)       ASSESSMENT AND PLAN:  HYPERTENSION, BENIGN - Plan: EKG 12-Lead, Hepatic function panel, Lipid Profile Blood pressure running high today even on recheck Long discussion about blood pressure goal Previous numbers reviewed, typically numbers are much better Recommended before we change her medications that she obtain additional blood pressure measurements from home Recommended she consider buying blood pressure cuff for close measuring outside of the office, call us with some numbers  Other hyperlipidemia - Plan: Hepatic function panel, Lipid Profile Long discussion concerning her cholesterol and the need to start cholesterol medication She is willing to retry medication given numbers have climbed, she continues to struggle with her weight We will send in prescription for vytorin. If this is too expensive, may need to split into 2 separate pills. She is hoping to keep her pills to a minimum   Total encounter time more than 15 minutes  Greater than 50% was spent in counseling and coordination of care with the patient    Disposition:   F/U  12 months   Orders Placed This Encounter  Procedures  . Hepatic function panel  . Lipid Profile  . EKG 12-Lead     Signed, Esmond Plants, M.D., Ph.D. 10/30/2016  [place], [place]

## 2016-10-30 NOTE — Patient Instructions (Addendum)
Medication Instructions:   Check the price of the vytorin 10/40 (cut in 1/2 daily)  Please monitor blood pressure Goal is <140 on the top, <90 on the bottom  Labwork:  Labs in 3 months (March 2018)  Testing/Procedures:  No further testing at this time   I recommend watching educational videos on topics of interest to you at:       [email or web address].goemmi.com  Enter code: HEARTCARE    Follow-Up: It was a pleasure seeing you in the office today. Please call us if you have new issues that need to be addressed before your next appt.  [phone]  Your physician wants you to follow-up in: 12 months.  You will receive a reminder letter in the mail two months in advance. If you don't receive a letter, please call our office to schedule the follow-up appointment.  If you need a refill on your cardiac medications before your next appointment, please call your pharmacy.

## 2017-04-26 ENCOUNTER — Telehealth: Payer: Self-pay | Admitting: Cardiovascular Disease

## 2017-04-26 NOTE — Telephone Encounter (Signed)
Spoke with patient. She reports chest tightness/discomfort that woke her up from her sleep early Sunday morning around 0300 am. She denies jaw or arm pain, nausea, vomiting, head ache or blurred vision. Does not feel like something is sitting on her chest. She states she has not had any foods lately that would cause indigestion. She said she had a little SOB and dizziness that did not last very long. She had another episode yesterday afternoon that lasted about 2 hours. She went to EMS and they checked her EKG and BP 170/90.  They advised it was up to the patient on what to do next but told her to take the EKG to her doctor whenever she did. Patient had another episode around 0300 am this morning that woke her up. She is at work now and is asymptomatic. She does not have her own BP cuff and does not know her current BP. She is taking medications at prescribed.  Offered patient an appt with Christell Faith, PA tomorrow but she declined at this time and asked to schedule with Dr Rockey Situ on 05/04/17.  Advised that if she has another reoccurance of this chest pain or discomfort then she should go to the ER and not wait to be seen next week as she should be evaluated before appt next week. She verbalized understanding of this.

## 2017-04-26 NOTE — Telephone Encounter (Signed)
Patient called back and spoke with scheduler and appt changed to tomorrow 04/27/17 with Christell Faith, PA.

## 2017-04-26 NOTE — Telephone Encounter (Signed)
Pt calling stating yesterday she had an episode   Started about 3am yesterday on and off. She thinks it was heartburn  But she went to her local EMS and they did a BP check it was 170/90 and EKG  They told her she see some "blips" nothing much they told her only to call us   She felt it was a light tightness in her chest no sob or really cp she states  Would like some advise on this Please call back

## 2017-04-27 ENCOUNTER — Ambulatory Visit (INDEPENDENT_AMBULATORY_CARE_PROVIDER_SITE_OTHER): Payer: BLUE CROSS/BLUE SHIELD | Admitting: Physician Assistant

## 2017-04-27 ENCOUNTER — Encounter: Payer: Self-pay | Admitting: Physician Assistant

## 2017-04-27 VITALS — BP 126/90 | HR 66 | Ht 62.0 in | Wt 181.0 lb

## 2017-04-27 DIAGNOSIS — E782 Mixed hyperlipidemia: Secondary | ICD-10-CM

## 2017-04-27 DIAGNOSIS — R0602 Shortness of breath: Secondary | ICD-10-CM

## 2017-04-27 DIAGNOSIS — R079 Chest pain, unspecified: Secondary | ICD-10-CM

## 2017-04-27 DIAGNOSIS — I10 Essential (primary) hypertension: Secondary | ICD-10-CM

## 2017-04-27 NOTE — Patient Instructions (Addendum)
Labwork:  Please go to the [facility] and check in at the first desk on the right the morning of your stress test at 07:15 AM . Let them know you are there for labs first and then stress test after that.   Testing/Procedures: [place]  Your caregiver has ordered a Stress Test with nuclear imaging. The purpose of this test is to evaluate the blood supply to your heart muscle. This procedure is referred to as a "Non-Invasive Stress Test." This is because other than having an IV started in your vein, nothing is inserted or "invades" your body. Cardiac stress tests are done to find areas of poor blood flow to the heart by determining the extent of coronary artery disease (CAD). Some patients exercise on a treadmill, which naturally increases the blood flow to your heart, while others who are  unable to walk on a treadmill due to physical limitations have a pharmacologic/chemical stress agent called Lexiscan . This medicine will mimic walking on a treadmill by temporarily increasing your coronary blood flow.   Please note: these test may take anywhere between 2-4 hours to complete  PLEASE REPORT TO [place] AT THE FIRST DESK WILL DIRECT YOU WHERE TO GO  Date of Procedure:_Thursday June 7, 2018____  Arrival Time for Procedure:_Arrive at 07:45AM___    PLEASE NOTIFY THE OFFICE AT LEAST 24 HOURS IN ADVANCE IF YOU ARE UNABLE TO [place].  [phone] AND  PLEASE NOTIFY NUCLEAR MEDICINE AT [facility] AT LEAST 24 HOURS IN ADVANCE IF YOU ARE UNABLE TO KEEP YOUR APPOINTMENT. [phone]  How to prepare for your Myoview test:  1. Do not eat or drink after midnight 2. No caffeine for 24 hours prior to test 3. No smoking 24 hours prior to test. 4. Your medication may be taken with water.  If your doctor stopped a medication because of this test, do not take that medication. 5. Ladies, please do not wear dresses.  Skirts or pants are  appropriate. Please wear a short sleeve shirt. 6. No perfume, cologne or lotion. 7. Wear comfortable walking shoes. No heels!   Follow-Up: Your physician recommends that you schedule a follow-up appointment after testing.   It was a pleasure seeing you today here in the office. Please do not hesitate to give [place] a call back if you have any further questions. [place], BSN    Cardiac Nuclear Scan A cardiac nuclear scan is a test that measures blood flow to the heart when a person is resting and when he or she is exercising. The test looks for problems such as:  Not enough blood reaching a portion of the heart.  The heart muscle not working normally.  You may need this test if:  You have heart disease.  You have had abnormal lab results.  You have had heart surgery or angioplasty.  You have chest pain.  You have shortness of breath.  In this test, a radioactive dye (tracer) is injected into your bloodstream. After the tracer has traveled to your heart, an imaging device is used to measure how much of the tracer is absorbed by or distributed to various areas of your heart. This procedure is usually done at a hospital and takes 2-4 hours. Tell a health care provider about:  Any allergies you have.  All medicines you are taking, including vitamins, herbs, eye drops, creams, and over-the-counter medicines.  Any problems you or family  members have had with the use of anesthetic medicines.  Any blood disorders you have.  Any surgeries you have had.  Any medical conditions you have.  Whether you are pregnant or may be pregnant. What are the risks? Generally, this is a safe procedure. However, problems may occur, including:  Serious chest pain and heart attack. This is only a risk if the stress portion of the test is done.  Rapid heartbeat.  Sensation of warmth in your chest. This usually passes quickly.  What happens before the procedure?  Ask your  health care provider about changing or stopping your regular medicines. This is especially important if you are taking diabetes medicines or blood thinners.  Remove your jewelry on the day of the procedure. What happens during the procedure?  An IV tube will be inserted into one of your veins.  Your health care provider will inject a small amount of radioactive tracer through the tube.  You will wait for 20-40 minutes while the tracer travels through your bloodstream.  Your heart activity will be monitored with an electrocardiogram (ECG).  You will lie down on an exam table.  Images of your heart will be taken for about 15-20 minutes.  You may be asked to exercise on a treadmill or stationary bike. While you exercise, your heart's activity will be monitored with an ECG, and your blood pressure will be checked. If you are unable to exercise, you may be given a medicine to increase blood flow to parts of your heart.  When blood flow to your heart has peaked, a tracer will again be injected through the IV tube.  After 20-40 minutes, you will get back on the exam table and have more images taken of your heart.  When the procedure is over, your IV tube will be removed. The procedure may vary among health care providers and hospitals. Depending on the type of tracer used, scans may need to be repeated 3-4 hours later. What happens after the procedure?  Unless your health care provider tells you otherwise, you may return to your normal schedule, including diet, activities, and medicines.  Unless your health care provider tells you otherwise, you may increase your fluid intake. This will help flush the contrast dye from your body. Drink enough fluid to keep your urine clear or pale yellow.  It is up to you to get your test results. Ask your health care provider, or the department that is doing the test, when your results will be ready. Summary  A cardiac nuclear scan measures the blood flow  to the heart when a person is resting and when he or she is exercising.  You may need this test if you are at risk for heart disease.  Tell your health care provider if you are pregnant.  Unless your health care provider tells you otherwise, increase your fluid intake. This will help flush the contrast dye from your body. Drink enough fluid to keep your urine clear or pale yellow. This information is not intended to replace advice given to you by your health care provider. Make sure you discuss any questions you have with your health care provider. Document Released: 12/04/2004 Document Revised: 11/11/2016 Document Reviewed: 10/18/2013 Elsevier Interactive Patient Education  2017 [employer].

## 2017-04-27 NOTE — Progress Notes (Signed)
Cardiology Office Note Date:  04/27/2017  Patient ID:  Carla [name] [birth year], MRN [id number] PCP:  Patient, No Pcp Per  Cardiologist:  Dr. Rockey Situ, MD    Chief Complaint: Chest pain  History of Present Illness: Carla [name] is a 56 y.o. female with history of asympatomatic bradycardia, strong family history of CAD, HTN, HLD, and venous insufficiency who presents for evaluation of chest pain.  Prior calcium score of 0 in 2016. Previously on Vytorin-->Lipitor 2/2 cost-->Crestor for uncertain reasons-->myalgias leading to discontinuation of statin. Last lipid panel from 06/2016 with TC 288, LDL 186, HDL 46, TG 281. Seen by endo, recommended restarting statin. Currently, back on Vytorin.   No formal exercise routine. She was last seen by Dr. Rockey Situ in 10/2016 and doing fairly well at that time. BP was noted to be elevated in the 193X systolic. Home monitoring was advised. She was given a retrial of Vytorin.  She called the office on 6/4 with noted chest pain/tightness that woke her up on 6/3 around 3 AM. Pain last approximatly 1 hour and self resolved. Pain did return intermittently shortly after, though not as severe and improved with eating. She again had another episode in the afternoon of 6/3 while outside building a chicken coup. She had a third episode again around 3 AM on 6/4 that too lasted approximately 1 hour and self resolved. This led to her presenting to EMS. At that time she was asymptomatic. EKG not acute (she brought for review). BP was noted to be 170/90. She discussed with a friend who advised try Prilosec, she thinks this may have helped, but she is not certain. She has not had any further chest pain. Pain is substernal and does not radiate. No associated symptoms. Currently chest pain free.    Past Medical History:  Diagnosis Date  . HLD (hyperlipidemia)   . HTN (hypertension)   . Venous insufficiency     Past Surgical History:  Procedure Laterality Date  .  hysterctomy      Current Outpatient Prescriptions  Medication Sig Dispense Refill  . aspirin 81 MG tablet Take 81 mg by mouth daily.      . calcium carbonate (OS-CAL) 600 MG TABS Take 600 mg by mouth daily.      Marland Kitchen estradiol (ESTRACE) 1 MG tablet Take 1 mg by mouth daily.     Marland Kitchen ezetimibe-simvastatin (VYTORIN) 10-40 MG tablet Take 1 tablet by mouth daily. 90 tablet 4  . losartan (COZAAR) 100 MG tablet TAKE ONE (1) TABLET BY MOUTH EVERY DAY 90 tablet 3  . Multiple Vitamin (MULTIVITAMIN) tablet Take 1 tablet by mouth daily.      . Omega-3 Fatty Acids (FISH OIL) 1200 MG CAPS Take 1 capsule by mouth daily.      Marland Kitchen omeprazole (PRILOSEC) 10 MG capsule Take 10 mg by mouth daily.    . Probiotic Product (PROBIOTIC-10 PO) Take by mouth daily.    . vitamin E 400 UNIT capsule Take 400 Units by mouth daily.       No current facility-administered medications for this visit.     Allergies:   Patient has no known allergies.   Social History:  The patient  reports that she has never smoked. She has never used smokeless tobacco. She reports that she does not drink alcohol or use drugs.   Family History:  The patient's Family history is unknown by patient.  ROS:   Review of Systems  Constitutional: Positive for malaise/fatigue. Negative for  chills, diaphoresis, fever and weight loss.  HENT: Negative for congestion.   Eyes: Negative for discharge and redness.  Respiratory: Negative for cough, hemoptysis, sputum production, shortness of breath and wheezing.   Cardiovascular: Positive for chest pain. Negative for palpitations, orthopnea, claudication, leg swelling and PND.  Gastrointestinal: Negative for abdominal pain, blood in stool, heartburn, melena, nausea and vomiting.  Genitourinary: Negative for hematuria.  Musculoskeletal: Negative for falls and myalgias.  Skin: Negative for rash.  Neurological: Positive for weakness. Negative for dizziness, tingling, tremors, sensory change, speech change, focal  weakness and loss of consciousness.  Endo/Heme/Allergies: Does not bruise/bleed easily.  Psychiatric/Behavioral: Negative for substance abuse. The patient is not nervous/anxious.      PHYSICAL EXAM:  VS:  BP 126/90 (BP Location: Left Arm, Patient Position: Sitting, Cuff Size: Normal)   Pulse 66   Ht 5\' 2"  (1.575 m)   Wt 181 lb (82.1 kg)   BMI 33.11 kg/m  BMI: Body mass index is 33.11 kg/m.  Physical Exam  Constitutional: She is oriented to person, place, and time. She appears well-developed and well-nourished.  HENT:  Head: Normocephalic and atraumatic.  Eyes: Right eye exhibits no discharge. Left eye exhibits no discharge.  Neck: Normal range of motion. No JVD present.  Cardiovascular: Normal rate, regular rhythm, S1 normal, S2 normal and normal heart sounds.  Exam reveals no distant heart sounds, no friction rub, no midsystolic click and no opening snap.   No murmur heard. Pulmonary/Chest: Effort normal and breath sounds normal. No respiratory distress. She has no decreased breath sounds. She has no wheezes. She has no rales. She exhibits no tenderness.  Abdominal: Soft. She exhibits no distension. There is no tenderness.  Musculoskeletal: She exhibits no edema.  Neurological: She is alert and oriented to person, place, and time.  Skin: Skin is warm and dry. No cyanosis. Nails show no clubbing.  Psychiatric: She has a normal mood and affect. Her speech is normal and behavior is normal. Judgment and thought content normal.     EKG:  Was ordered and interpreted by me today. Shows NSR, 66 bpm, no acute st/t changes  Recent Labs: No results found for requested labs within last 8760 hours.  No results found for requested labs within last 8760 hours.   CrCl cannot be calculated (No order found.).   Wt Readings from Last 3 Encounters:  04/27/17 181 lb (82.1 kg)  10/30/16 180 lb 4 oz (81.8 kg)  10/30/15 181 lb 8 oz (82.3 kg)     Other studies reviewed: Additional  studies/records reviewed today include: summarized above  ASSESSMENT AND PLAN:  1. Chest pain with moderate cardiac risk: Pain is somewhat concerning for ischemia given timing and also associated with exertion. She does have significant family history of CAD with her father having a bypass in his 35s. She also has poorly controlled HLD. Discussed evaluation options. She prefers nuclear stress testing over LHC, I agree. Will schedule nuclear stress test this week. Continue ASA.   2. HTN: Controlled. Continue current medications. She asks about her diastolic being 90 mmHg. Will continue to follow and not treat an isolated reading.  3. HLD: On Vytorin. Intolerant to Lipitor, though she is not certain if she ever took Crestor. Perhaps she would benefit from a referral to the lipid clinic in [place] for evaluation of PCSK9 inhibitor.  4. Family history of severe CAD: As above.  Disposition: F/u with me in s/p stress test  Current medicines are reviewed at length with  the patient today.  The patient did not have any concerns regarding medicines.  Melvern Banker PA-C 04/27/2017 2:20 PM     [place] [address] [place], [place] [address] [phone]

## 2017-04-28 ENCOUNTER — Telehealth: Payer: Self-pay | Admitting: Physician Assistant

## 2017-04-28 NOTE — Telephone Encounter (Signed)
Pt needs to cancel stress test, and would like a nurse to call to discuss the reason why.

## 2017-04-28 NOTE — Telephone Encounter (Signed)
Pt called back, and she does want to keep her stress test appointment. Pt does request a call back to make sure the test was not cancelled.

## 2017-04-28 NOTE — Telephone Encounter (Signed)
Spoke with patient and she states that she would in fact like to keep scheduled stress test. She reports that with insurance her cost would still be around $1,200.00 and that was unexpected. After more consideration she decided to just proceed with testing. Reviewed instructions with her and she verbalized understanding with no further questions at this time.

## 2017-04-29 ENCOUNTER — Encounter
Admission: RE | Admit: 2017-04-29 | Discharge: 2017-04-29 | Disposition: A | Discharge status: Home / Self Care | Payer: BLUE CROSS/BLUE SHIELD | Source: Ambulatory Visit | Attending: Physician Assistant | Admitting: Physician Assistant

## 2017-04-29 ENCOUNTER — Other Ambulatory Visit
Admission: RE | Admit: 2017-04-29 | Discharge: 2017-04-29 | Disposition: A | Discharge status: Home / Self Care | Payer: BLUE CROSS/BLUE SHIELD | Source: Ambulatory Visit | Attending: Physician Assistant | Admitting: Physician Assistant

## 2017-04-29 DIAGNOSIS — R079 Chest pain, unspecified: Secondary | ICD-10-CM

## 2017-04-29 DIAGNOSIS — E782 Mixed hyperlipidemia: Secondary | ICD-10-CM | POA: Diagnosis not present

## 2017-04-29 DIAGNOSIS — I10 Essential (primary) hypertension: Secondary | ICD-10-CM | POA: Diagnosis not present

## 2017-04-29 DIAGNOSIS — R0602 Shortness of breath: Secondary | ICD-10-CM

## 2017-04-29 DIAGNOSIS — I872 Venous insufficiency (chronic) (peripheral): Secondary | ICD-10-CM | POA: Insufficient documentation

## 2017-04-29 LAB — CBC WITH DIFFERENTIAL/PLATELET
BASOS ABS: 0 10*3/uL (ref 0–0.1)
BASOS PCT: 1 %
Eosinophils Absolute: 0.1 10*3/uL (ref 0–0.7)
Eosinophils Relative: 1 %
HEMATOCRIT: 41.6 % (ref 35.0–47.0)
HEMOGLOBIN: 14.2 g/dL (ref 12.0–16.0)
LYMPHS PCT: 27 %
Lymphs Abs: 1.6 10*3/uL (ref 1.0–3.6)
MCH: 29.7 pg (ref 26.0–34.0)
MCHC: 34.2 g/dL (ref 32.0–36.0)
MCV: 87.1 fL (ref 80.0–100.0)
Monocytes Absolute: 0.5 10*3/uL (ref 0.2–0.9)
Monocytes Relative: 8 %
NEUTROS ABS: 3.7 10*3/uL (ref 1.4–6.5)
NEUTROS PCT: 63 %
Platelets: 182 10*3/uL (ref 150–440)
RBC: 4.77 MIL/uL (ref 3.80–5.20)
RDW: 13.3 % (ref 11.5–14.5)
WBC: 5.9 10*3/uL (ref 3.6–11.0)

## 2017-04-29 LAB — NM MYOCAR MULTI W/SPECT W/WALL MOTION / EF
CHL CUP NUCLEAR SRS: 1
CHL CUP RESTING HR STRESS: 54 {beats}/min
CSEPEW: 11.7 METS
CSEPPHR: 157 {beats}/min
Exercise duration (min): 10 min
Exercise duration (sec): 30 s
LV dias vol: 43 mL (ref 46–106)
LV sys vol: 16 mL
MPHR: 165 {beats}/min
NUC STRESS TID: 0.84
Percent HR: 95 %
SDS: 0
SSS: 0

## 2017-04-29 LAB — TSH: TSH: 2.823 u[IU]/mL (ref 0.350–4.500)

## 2017-04-29 LAB — LIPID PANEL
Cholesterol: 194 mg/dL (ref 0–200)
HDL: 48 mg/dL (ref >40)
LDL Cholesterol: 108 mg/dL — ABNORMAL HIGH (ref 0–99)
Total CHOL/HDL Ratio: 4 RATIO
Triglycerides: 190 mg/dL — ABNORMAL HIGH (ref <150)
VLDL: 38 mg/dL (ref 0–40)

## 2017-04-29 LAB — COMPREHENSIVE METABOLIC PANEL
ALBUMIN: 4.4 g/dL (ref 3.5–5.0)
ALT: 15 U/L (ref 14–54)
AST: 22 U/L (ref 15–41)
Alkaline Phosphatase: 27 U/L — ABNORMAL LOW (ref 38–126)
Anion gap: 8 (ref 5–15)
BILIRUBIN TOTAL: 0.7 mg/dL (ref 0.3–1.2)
BUN: 18 mg/dL (ref 6–20)
CALCIUM: 9.4 mg/dL (ref 8.9–10.3)
CO2: 27 mmol/L (ref 22–32)
CREATININE: 0.51 mg/dL (ref 0.44–1.00)
Chloride: 104 mmol/L (ref 101–111)
GFR calc Af Amer: >60 mL/min (ref >60)
GFR calc non Af Amer: >60 mL/min (ref >60)
GLUCOSE: 104 mg/dL — AB (ref 65–99)
Potassium: 4.4 mmol/L (ref 3.5–5.1)
SODIUM: 139 mmol/L (ref 135–145)
Total Protein: 7.6 g/dL (ref 6.5–8.1)

## 2017-04-29 MED ORDER — TECHNETIUM TC 99M TETROFOSMIN IV KIT
32.3700 | PACK | Freq: Once | INTRAVENOUS | Status: AC | PRN
  Administered 2017-04-29: 32.37 via INTRAVENOUS

## 2017-04-29 MED ORDER — TECHNETIUM TC 99M TETROFOSMIN IV KIT
13.0000 | PACK | Freq: Once | INTRAVENOUS | Status: AC | PRN
  Administered 2017-04-29: 13.76 via INTRAVENOUS

## 2017-04-30 ENCOUNTER — Other Ambulatory Visit: Payer: Self-pay

## 2017-04-30 MED ORDER — PANTOPRAZOLE SODIUM 20 MG PO TBEC: 20.0000 mg | DELAYED_RELEASE_TABLET | Freq: Every day | ORAL | 3 refills | Status: DC

## 2017-05-03 ENCOUNTER — Other Ambulatory Visit: Payer: BLUE CROSS/BLUE SHIELD

## 2017-05-04 ENCOUNTER — Ambulatory Visit: Payer: BLUE CROSS/BLUE SHIELD | Admitting: Cardiovascular Disease

## 2017-05-14 ENCOUNTER — Other Ambulatory Visit: Payer: BLUE CROSS/BLUE SHIELD

## 2017-05-28 ENCOUNTER — Ambulatory Visit: Payer: BLUE CROSS/BLUE SHIELD | Admitting: Cardiovascular Disease

## 2017-06-02 ENCOUNTER — Encounter: Payer: Self-pay | Admitting: Family Medicine

## 2017-06-02 ENCOUNTER — Ambulatory Visit (INDEPENDENT_AMBULATORY_CARE_PROVIDER_SITE_OTHER): Payer: BLUE CROSS/BLUE SHIELD | Admitting: Family Medicine

## 2017-06-02 VITALS — BP 125/72 | HR 65 | Temp 98.5°F | Resp 16 | Ht 62.0 in | Wt 180.6 lb

## 2017-06-02 DIAGNOSIS — H6982 Other specified disorders of Eustachian tube, left ear: Secondary | ICD-10-CM | POA: Diagnosis not present

## 2017-06-02 MED ORDER — LORATADINE 10 MG PO TABS: 10.0000 mg | ORAL_TABLET | Freq: Every day | ORAL | 11 refills | Status: DC

## 2017-06-02 MED ORDER — FLUTICASONE PROPIONATE 50 MCG/ACT NA SUSP: 2.0000 | Freq: Every day | NASAL | 3 refills | Status: DC

## 2017-06-02 NOTE — Patient Instructions (Addendum)
Thank you for coming to the clinic today.  1.  You have some Eustachian Tube Dysfunction, this problem is usually caused by some deeper sinus swelling and pressure, causing difficulty of eustachian tubes to clear fluid from behind ear drum. You can have ear pain, pressure, fullness, loss of hearing. Often related to sinus symptoms and sometimes with sinusitis or infection or allergy symptoms.  Treatment: - Start using nasal steroid spray, Flonase 2 sprays in each nostril every day for at least 4-6 weeks, and maybe longer - Start OTC allergy medicine (Claritin, Zyrtec, or Allegra - or generics) once daily - For significant ear/sinus pressure, try OTC decongestant such as Phenylephrine or similar medicines, included in DayQuil, take this for up to 1 week or less, no longer - AVOID Afrin nasal spray, if you use this do not use more than 3 days or can make sinus swelling worse  Look into "Galbreath Technique" for ear effusion, can do this simple massage 1-2 x daily for few days then stop. And use as needed. May also use a warm moist wash cloth to help loosen up this tissue before or after doing this to help open up eustachian tube.  If any significant worsening, loss of hearing, constant pain, fever/chills, or concern for infection - notify office and we can send in an antibiotic. Or if just persistent pressure that is not improving, you may contact me back within 1 week and we can consider a brief course of oral steroid prednisone for 3 days only to help reduce swelling, as discussed this is not ideal treatment and can cause side effects.  Please contact Dr Ouida Sills (GYN) for faxing a copy of last Mammogram and when you see other specialist make sure that they fax [place] a copy of your last office note. (we do not need any records from Cardiology)  Please schedule a Follow-up Appointment to: Return in about 3 weeks (around 06/23/2017), or if symptoms worsen or fail to improve, for L ear eustachian  dysfunction.  If you have any other questions or concerns, please feel free to call the clinic or send a message through [place]. You may also schedule an earlier appointment if necessary.  Additionally, you may be receiving a survey about your experience at our clinic within a few days to 1 week by e-mail or mail. We value your feedback.  Carla Putnam, DO [place]

## 2017-06-02 NOTE — Progress Notes (Signed)
Subjective:    Patient ID: Carla [name], female    DOB: Jun 23, 1961, 56 y.o.   MRN: [id number]  DEEANNA BEIGHTOL is a 56 y.o. female presenting on 06/02/2017 for Hearing Problem (more on Left side onset month)   HPI   Left Ear Reduced Hearing / Eustachian Tube Dysfunction - Reports persistent gradual worsening L > R slightly reduced hearing and sensation of fullness with feeling occasional "crackling and popping" in her Left ear. She denies any significant pain or recent illness associated. Does not seem to have any sinus symptoms either. No known seasonal or environmental allergies. - No prior other hearing problems before. Does not use Q-tips - Her son is patient here and presented previously for ear wax removal for relief of his hearing problem and she was concerned that she may have ear wax - Denies any fevers/chills, sinus congestion, sinus pressure or pain, ear drainage, headache, complete loss of hearing, cough  Social History  Substance Use Topics  . Smoking status: Never Smoker  . Smokeless tobacco: Never Used  . Alcohol use No    Review of Systems Per HPI unless specifically indicated above     Objective:    BP 125/72   Pulse 65   Temp 98.5 F (36.9 C) (Oral)   Resp 16   Ht '5\' 2"'  (1.575 m)   Wt 180 lb 9.6 oz (81.9 kg)   LMP 04/29/2002 (Approximate) Comment: Hysterectomy 15 yrs ago per pt  BMI 33.03 kg/m   Wt Readings from Last 3 Encounters:  06/02/17 180 lb 9.6 oz (81.9 kg)  04/27/17 181 lb (82.1 kg)  10/30/16 180 lb 4 oz (81.8 kg)    Physical Exam  Constitutional: She is oriented to person, place, and time. She appears well-developed and well-nourished. No distress.  Well-appearing, comfortable, cooperative  HENT:  Head: Normocephalic and atraumatic.  Mouth/Throat: Oropharynx is clear and moist.  Frontal / maxillary sinuses non-tender. Nares with mild deeper turbinate edema without congestion or purulence.  No external ear tenderness or  abnormality.  L TM with significant fullness and bulging opaque effusion loss of landmarks. No erythema. No purulence.  R TM with moderate fullness with clear effusion. No erythema. No purulence.  No cerumen seen.  Oropharynx clear without erythema, exudates, edema or asymmetry.  Eyes: Conjunctivae are normal. Right eye exhibits no discharge. Left eye exhibits no discharge.  Neck: Normal range of motion. Neck supple.  Cardiovascular: Normal rate.   Pulmonary/Chest: Effort normal.  Musculoskeletal: She exhibits no edema.  Lymphadenopathy:    She has no cervical adenopathy.  Neurological: She is alert and oriented to person, place, and time.  Skin: Skin is warm and dry. No rash noted. She is not diaphoretic. No erythema.  Psychiatric: She has a normal mood and affect. Her behavior is normal.  Well groomed, good eye contact, normal speech and thoughts  Nursing note and vitals reviewed.  Results for orders placed or performed during the hospital encounter of 04/29/17  Lipid panel  Result Value Ref Range   Cholesterol 194 0 - 200 mg/dL   Triglycerides 190 (H) <150 mg/dL   HDL 48 >40 mg/dL   Total CHOL/HDL Ratio 4.0 RATIO   VLDL 38 0 - 40 mg/dL   LDL Cholesterol 108 (H) 0 - 99 mg/dL  CBC with Differential/Platelet  Result Value Ref Range   WBC 5.9 3.6 - 11.0 K/uL   RBC 4.77 3.80 - 5.20 MIL/uL   Hemoglobin 14.2 12.0 - 16.0 g/dL  HCT 41.6 35.0 - 47.0 %   MCV 87.1 80.0 - 100.0 fL   MCH 29.7 26.0 - 34.0 pg   MCHC 34.2 32.0 - 36.0 g/dL   RDW 13.3 11.5 - 14.5 %   Platelets 182 150 - 440 K/uL   Neutrophils Relative % 63 %   Neutro Abs 3.7 1.4 - 6.5 K/uL   Lymphocytes Relative 27 %   Lymphs Abs 1.6 1.0 - 3.6 K/uL   Monocytes Relative 8 %   Monocytes Absolute 0.5 0.2 - 0.9 K/uL   Eosinophils Relative 1 %   Eosinophils Absolute 0.1 0 - 0.7 K/uL   Basophils Relative 1 %   Basophils Absolute 0.0 0 - 0.1 K/uL  TSH  Result Value Ref Range   TSH 2.823 0.350 - 4.500 uIU/mL  Comp Met  (CMET)  Result Value Ref Range   Sodium 139 135 - 145 mmol/L   Potassium 4.4 3.5 - 5.1 mmol/L   Chloride 104 101 - 111 mmol/L   CO2 27 22 - 32 mmol/L   Glucose, Bld 104 (H) 65 - 99 mg/dL   BUN 18 6 - 20 mg/dL   Creatinine, Ser 0.51 0.44 - 1.00 mg/dL   Calcium 9.4 8.9 - 10.3 mg/dL   Total Protein 7.6 6.5 - 8.1 g/dL   Albumin 4.4 3.5 - 5.0 g/dL   AST 22 15 - 41 U/L   ALT 15 14 - 54 U/L   Alkaline Phosphatase 27 (L) 38 - 126 U/L   Total Bilirubin 0.7 0.3 - 1.2 mg/dL   GFR calc non Af Amer >60 >60 mL/min   GFR calc Af Amer >60 >60 mL/min   Anion gap 8 5 - 15      Assessment & Plan:   Problem List Items Addressed This Visit    None    Visit Diagnoses    Acute dysfunction of left eustachian tube    -  Primary  Subacute bilateral L > R bilateral eustachian tube dysfunction with secondary bilateral ear effusion, with some mild loss of hearing. No evidence of infection AOM or sinusitis. Likely related allergic rhinosinusitis based on exam and history. - Inadequate conservative therapy  Plan: 1. Start new rx Flonase 2 sprays each nare daily for up to 4-6 weeks or longer 2. Start OTC oral anti-histamine daily 3. If not improved may consider trial OTC oral decongestant for up to 1 week or less 4. Demonstrated Galbreath Technique today. Advised can use warm moist heat in his area and may try this technique as demonstrated 1-2x daily 2-3 days then stop, avoid over-use 5. Additionally discussed potential benefit of oral steroid, prednisone taper for short course up to 42m daily x 3 days, only if refractory symptoms, counseled on potential side effects 6. Follow-up if not improved or worsening, return criteria - may need ENT in future if progressive, her cousin is Dr CBeverly Gustand she could schedule but will try this treatment first     Relevant Medications   fluticasone (FLONASE) 50 MCG/ACT nasal spray   loratadine (CLARITIN) 10 MG tablet      Meds ordered this encounter   Medications  . fluticasone (FLONASE) 50 MCG/ACT nasal spray    Sig: Place 2 sprays into both nostrils daily. Use for 4-6 weeks then stop and use seasonally or as needed.    Dispense:  16 g    Refill:  3  . loratadine (CLARITIN) 10 MG tablet    Sig: Take 1 tablet (10 mg  total) by mouth daily. Use for 4-6 weeks then stop, and use as needed or seasonally    Dispense:  30 tablet    Refill:  11      Follow up plan: Return in about 3 weeks (around 06/23/2017), or if symptoms worsen or fail to improve, for L ear eustachian dysfunction.  Nobie Putnam, [facility] 06/02/2017, 3:06 PM

## 2017-06-29 DIAGNOSIS — H903 Sensorineural hearing loss, bilateral: Secondary | ICD-10-CM | POA: Diagnosis not present

## 2017-06-29 DIAGNOSIS — H6983 Other specified disorders of Eustachian tube, bilateral: Secondary | ICD-10-CM | POA: Diagnosis not present

## 2017-07-07 ENCOUNTER — Telehealth: Payer: Self-pay | Admitting: Cardiovascular Disease

## 2017-07-07 NOTE — Telephone Encounter (Signed)
Pt is calling asking why she needs to come in for her 6 Month fu   Please advise.

## 2017-07-07 NOTE — Telephone Encounter (Signed)
Patient just wants to know why she needs to come back in to be seen since she just had extensive testing which had normal results. Let her know that we usually have patients come in to discuss their results and symptoms. She states that she is feeling fine and really does not want to come if at all possible. Spoke with her in detail and she would just like to go back to yearly visits and would like to postpone her visit to July 2019. Let her know that I would make them aware and have them place a reminder to send her a letter closer to that time. Instructed her to give [place] a call if she needs anything before that time. She was appreciative for the call back and had no further questions or concerns at this time.

## 2017-07-15 DIAGNOSIS — Z6831 Body mass index (BMI) 31.0-31.9, adult: Secondary | ICD-10-CM | POA: Diagnosis not present

## 2017-07-15 DIAGNOSIS — Z1272 Encounter for screening for malignant neoplasm of vagina: Secondary | ICD-10-CM | POA: Diagnosis not present

## 2017-07-15 DIAGNOSIS — Z01419 Encounter for gynecological examination (general) (routine) without abnormal findings: Secondary | ICD-10-CM | POA: Diagnosis not present

## 2017-07-15 DIAGNOSIS — Z124 Encounter for screening for malignant neoplasm of cervix: Secondary | ICD-10-CM | POA: Diagnosis not present

## 2017-07-15 DIAGNOSIS — Z1231 Encounter for screening mammogram for malignant neoplasm of breast: Secondary | ICD-10-CM | POA: Diagnosis not present

## 2017-07-15 LAB — HM MAMMOGRAPHY

## 2017-07-21 DIAGNOSIS — E784 Other hyperlipidemia: Secondary | ICD-10-CM | POA: Diagnosis not present

## 2017-07-21 DIAGNOSIS — E048 Other specified nontoxic goiter: Secondary | ICD-10-CM | POA: Diagnosis not present

## 2017-07-21 DIAGNOSIS — Z1389 Encounter for screening for other disorder: Secondary | ICD-10-CM | POA: Diagnosis not present

## 2017-07-21 DIAGNOSIS — E221 Hyperprolactinemia: Secondary | ICD-10-CM | POA: Diagnosis not present

## 2017-07-21 DIAGNOSIS — D352 Benign neoplasm of pituitary gland: Secondary | ICD-10-CM | POA: Diagnosis not present

## 2017-07-23 ENCOUNTER — Other Ambulatory Visit: Payer: Self-pay | Admitting: Endocrinology

## 2017-07-23 DIAGNOSIS — R0989 Other specified symptoms and signs involving the circulatory and respiratory systems: Secondary | ICD-10-CM

## 2017-07-29 ENCOUNTER — Ambulatory Visit
Admission: RE | Admit: 2017-07-29 | Discharge: 2017-07-29 | Disposition: A | Discharge status: Home / Self Care | Payer: BLUE CROSS/BLUE SHIELD | Source: Ambulatory Visit | Attending: Endocrinology | Admitting: Endocrinology

## 2017-07-29 DIAGNOSIS — R0989 Other specified symptoms and signs involving the circulatory and respiratory systems: Secondary | ICD-10-CM

## 2017-07-29 DIAGNOSIS — I6523 Occlusion and stenosis of bilateral carotid arteries: Secondary | ICD-10-CM | POA: Diagnosis not present

## 2017-07-30 ENCOUNTER — Other Ambulatory Visit: Payer: BLUE CROSS/BLUE SHIELD

## 2017-08-18 DIAGNOSIS — E042 Nontoxic multinodular goiter: Secondary | ICD-10-CM | POA: Diagnosis not present

## 2017-09-13 DIAGNOSIS — L820 Inflamed seborrheic keratosis: Secondary | ICD-10-CM | POA: Diagnosis not present

## 2017-09-13 DIAGNOSIS — L821 Other seborrheic keratosis: Secondary | ICD-10-CM | POA: Diagnosis not present

## 2017-09-13 DIAGNOSIS — L738 Other specified follicular disorders: Secondary | ICD-10-CM | POA: Diagnosis not present

## 2017-10-06 ENCOUNTER — Telehealth: Payer: Self-pay | Admitting: Cardiovascular Disease

## 2017-10-06 NOTE — Telephone Encounter (Signed)
Pt calling stating she saw on the news there is a recall on her losartan   Would like a call back on this  Please call

## 2017-10-06 NOTE — Telephone Encounter (Signed)
Called patient. She is concerned about the recall that came out on her Losartan saying the drug might cause cancer. Advised I will route to Dr Rockey Situ and pharmacy for advice on the drug and further action. She was appreciative.

## 2017-10-06 NOTE — Telephone Encounter (Signed)
Patient verbalized understanding to call her pharmacy and she will call us back if she is affected.

## 2017-10-06 NOTE — Telephone Encounter (Signed)
Only 1 manufacturer has been affected by the recall so far. Would advise pt to contact her retail pharmacy - they can tell her which manufacturer she received and if it was affected by the recall.

## 2017-11-18 ENCOUNTER — Other Ambulatory Visit: Payer: Self-pay | Admitting: Cardiovascular Disease

## 2018-02-28 ENCOUNTER — Encounter: Payer: Self-pay | Admitting: Family Medicine

## 2018-02-28 ENCOUNTER — Ambulatory Visit (INDEPENDENT_AMBULATORY_CARE_PROVIDER_SITE_OTHER): Payer: BLUE CROSS/BLUE SHIELD | Admitting: Family Medicine

## 2018-02-28 VITALS — BP 112/66 | HR 66 | Temp 98.6°F | Resp 16 | Ht 62.0 in | Wt 175.0 lb

## 2018-02-28 DIAGNOSIS — L821 Other seborrheic keratosis: Secondary | ICD-10-CM

## 2018-02-28 NOTE — Progress Notes (Signed)
Subjective:    Patient ID: Carla [name], female    DOB: [birth year], 57 y.o.   MRN: [id number]  Carla [name] is a 57 y.o. female presenting on 02/28/2018 for Nevus (on back itchy )   HPI   Seborrheic Keratoses / Abnormal Skin Findings Reports new concern with abnormal skin findings by her report. She has a raised itchy spot on left low back, concerned about a mole. Also similar spot on left flank in area of bra strap, this one does not itch and is asymptomatic. - She thinks both spots have developed over past >1 year - Not used any topical treatments on it - Seems to be getting larger slightly - Previously saw Dermatology in [place] Dr Nehemiah Massed in past, no personal or fam history of skin cancer or melanoma. - Has had some moles removed before by Derm, and these were normal, has had cryo as well - Admits itching - Denies skin pain, redness swelling, fever chills, ulceration   Health Maintenance:  Breast CA Screening: Last mammogram result negative, bi-rads 1 (07/15/17) done at [facility] per Dr Freda Munro. No prior history abnormal mammogram. No known family history of breast cancer. Currently asymptomatic.  Colon CA Screening: Last Colonoscopy 09/18/13 (done by Dr Stacie Glaze [facility]), results with tubular adenoma polyp negative for high grade dysplasia, return in 5 years (due in or after 2019). Currently asymptomatic. No known family history of colon CA. Due for screening test in 2019 - advised to return to GI for colonoscopy.  No flowsheet data found.  Social History   Tobacco Use  . Smoking status: Never Smoker  . Smokeless tobacco: Never Used  Substance Use Topics  . Alcohol use: No    Alcohol/week: 0.5 oz    Types: 1 Standard drinks or equivalent per week  . Drug use: No    Review of Systems Per HPI unless specifically indicated above     Objective:    BP 112/66   Pulse 66   Temp 98.6 F (37 C) (Oral)   Resp 16   Ht 5\' 2"  (1.575 m)    Wt 175 lb (79.4 kg)   LMP 04/29/2002 (Approximate) Comment: Hysterectomy 15 yrs ago per pt  BMI 32.01 kg/m   Wt Readings from Last 3 Encounters:  02/28/18 175 lb (79.4 kg)  06/02/17 180 lb 9.6 oz (81.9 kg)  04/27/17 181 lb (82.1 kg)    Physical Exam  Constitutional: She is oriented to person, place, and time. She appears well-developed and well-nourished. No distress.  Cardiovascular: Normal rate.  Pulmonary/Chest: Effort normal.  Musculoskeletal: She exhibits no edema.  Neurological: She is alert and oriented to person, place, and time.  Skin: Skin is warm and dry. She is not diaphoretic.  Left Low Back: - See picture #1 - < 1 cm slightly raised mildly rough scaly lesion, consistent with SK, non pigmented however, no ulceration, localized erythema with inflammation  Left Flank - See picture #2 - 0.5 cm stuck on appearance slightly pigmented, consistent with SK, without inflammation or other abnormal features  Psychiatric: Her behavior is normal.  Nursing note and vitals reviewed.    Left Low Back    Left Flank     Results for orders placed or performed in visit on 02/28/18  HM MAMMOGRAPHY  Result Value Ref Range   HM Mammogram 0-4 Bi-Rad 0-4 Bi-Rad, Self Reported Normal  HM COLONOSCOPY  Result Value Ref Range   HM Colonoscopy See Report (in  chart) See Report (in chart), Patient Reported      Assessment & Plan:   Problem List Items Addressed This Visit    None    Visit Diagnoses    SK (seborrheic keratosis)    -  Primary      Clinically both skin spots of concern consistent with SKs based on exam and history. One spot on back is inflamed, some concern without pigmentation has abnormal appearance but does not appear to be pre-cancerous lesion. No other red flags, no other concerning history  Plan Offered referral or return to [facility] for evaluation and likely cryotherapy, however she declines at this time, and therefore, agree with  trial of OTC freezing/topical treatment since we do not have cryotherapy available in office. Handout given, return criteria if needed  No orders of the defined types were placed in this encounter.   Follow up plan: Return if symptoms worsen or fail to improve, for mole.   She may follow-up in future as needed, she prefers to f/u with her established GYN for wellness check up and other specialists for care.  Updated health maintenance deficiencies today, requested records, to be scanned into chart.  Nobie Putnam, [facility] 03/01/2018, 12:14 AM

## 2018-02-28 NOTE — Patient Instructions (Addendum)
Thank you for coming to the office today.  Seborrheic keratosis (seb-o-REE-ik care-uh-TOE-sis) is a common skin growth. It may seem worrisome because it can look like a wart, pre-cancerous skin growth (actinic keratosis), or skin cancer. Despite their appearance, seborrheic keratoses are harmless. Most people get these growths when they are middle aged or older. Because they begin at a later age and can have a wart-like appearance, seborrheic keratoses It's possible to have just one of these growths, but most people develop several. Some growths may have a warty surface while others look like dabs of warm, brown candle wax on the skin. Seborrheic keratoses range in color from white to black; however, most are tan or brown. You can find these harmless growths anywhere on the skin, except the palms and soles. Most often, you'll see them on the chest, back, head, or neck.  Try OTC Freezing treatment if interested to reduce symptoms if it resolves. Otherwise next step is Dermatology we can get you back in to see Dr Nehemiah Massed to do cryotherapy or to have full body skin check, they can probably schedule you without new referral since already patient.  Check with [employer] to find out Hepatitis C coverage - Screening test (blood)  Please schedule a Follow-up Appointment to: Return if symptoms worsen or fail to improve, for mole.  If you have any other questions or concerns, please feel free to call the office or send a message through [place]. You may also schedule an earlier appointment if necessary.  Additionally, you may be receiving a survey about your experience at our office within a few days to 1 week by e-mail or mail. We value your feedback.  Nobie Putnam, DO [place]

## 2018-03-01 ENCOUNTER — Encounter: Payer: Self-pay | Admitting: Family Medicine

## 2018-04-12 ENCOUNTER — Other Ambulatory Visit: Payer: Self-pay | Admitting: Cardiovascular Disease

## 2018-05-17 ENCOUNTER — Other Ambulatory Visit: Payer: Self-pay | Admitting: Cardiovascular Disease

## 2018-07-09 NOTE — Progress Notes (Signed)
Cardiology Office Note  Date:  07/11/2018   ID:  Carla [name], DOB November 09, 1961, MRN [id number]  PCP:  Olin Hauser, DO   Chief Complaint  Patient presents with  . other    12 month f/u no complaints today. Meds reviewed verbally with pt.    HPI:  Carla [name] is a 57 -year-old  woman with a history of  bradycardia,  hyperlipidemia,  hypertension  2.7 cm mixed cystic solid right thyroid nodule. Carotid: Minor atherosclerotic plaque formation  2018 who presents for routine followup of her hyperlipidemia and bradycardia  Hearing issues, lead to carotid ultrasound Showing thyroid nodule Has had biopsy, followed by endocrinology Continues to take her cholesterol medication Numbers drop 100 points in 2018  Reported having some atypical type chest pain 2018 Seen in clinic Had stress test showing no ischemia Suspect GI related better on PPI Stop the PPI with no recurrence of her symptoms  On today's visit feels well with no complaints Started walking program  Other past medical history reviewed In June 2016 she had CT coronary calcium score of 0  previously on HCTZ and this did not seem to help her ankle swelling Was likely secondary to a long trip, on her feet, venous insufficiency  EKG personally reviewed by myself on todays visit  shows normal sinus rhythm rate 56 bpm no significant ST or T-wave changes  Other past medical history Previously on Vytorin 5/20 milligrams daily, change to Lipitor for cost reasons Change to Crestor 10 mg daily Now off all statins secondary to myalgias  Other past medical history  Lost her father in September 2014. Still dealing with his estate. Overall active, takes care of 50 chickens  called the office on 04/26/17 with noted chest pain/tightness  3 episodes Called ems EKG not acute BP was noted to be 170/90.  Stress test 04/2017: no ischemia  Carotid u/s Minor atherosclerotic plaque formation.  2.7 cm mixed cystic  solid right thyroid nodule.  PMH:   has a past medical history of Anxiety, Depression, HLD (hyperlipidemia), HTN (hypertension), Thyroid disease, and Venous insufficiency.  PSH:    Past Surgical History:  Procedure Laterality Date  . BIOPSY THYROID    . hysterctomy      Current Outpatient Medications  Medication Sig Dispense Refill  . aspirin 81 MG tablet Take 81 mg by mouth daily.      . calcium carbonate (OS-CAL) 600 MG TABS Take 600 mg by mouth daily.      Marland Kitchen estradiol (ESTRACE) 1 MG tablet Take 1 mg by mouth daily.     Marland Kitchen ezetimibe-simvastatin (VYTORIN) 10-40 MG tablet Take 1 tablet by mouth daily. 90 tablet 4  . losartan (COZAAR) 100 MG tablet TAKE 1 TABLET DAILY. 90 tablet 3  . Multiple Vitamin (MULTIVITAMIN) tablet Take 1 tablet by mouth daily.      . Omega-3 Fatty Acids (FISH OIL) 1200 MG CAPS Take 1 capsule by mouth daily.      . vitamin E 400 UNIT capsule Take 400 Units by mouth daily.       No current facility-administered medications for this visit.      Allergies:   Patient has no known allergies.   Social History:  The patient  reports that she has never smoked. She has never used smokeless tobacco. She reports that she does not drink alcohol or use drugs.   Family History:   family history includes Atrial fibrillation in her father; CAD in her father; Cancer in  her mother; Heart disease in her father; Hyperlipidemia in her father.    Review of Systems: Review of Systems  Constitutional: Negative.   Respiratory: Negative.   Cardiovascular: Negative.   Gastrointestinal: Negative.   Musculoskeletal: Negative.   Neurological: Negative.   Psychiatric/Behavioral: Negative.   All other systems reviewed and are negative.    PHYSICAL EXAM: VS:  BP 120/90 (BP Location: Left Arm, Patient Position: Sitting, Cuff Size: Normal)   Pulse (!) 56   Ht 5\' 3"  (1.6 m)   Wt 176 lb (79.8 kg)   LMP 04/29/2002 (Approximate) Comment: Hysterectomy 15 yrs ago per pt  BMI 31.18  kg/m  , BMI Body mass index is 31.18 kg/m. Constitutional:  oriented to person, place, and time. No distress.  HENT:  Head: Normocephalic and atraumatic.  Eyes:  no discharge. No scleral icterus.  Neck: Normal range of motion. Neck supple. No JVD present.  Cardiovascular: Normal rate, regular rhythm, normal heart sounds and intact distal pulses. Exam reveals no gallop and no friction rub. No edema No murmur heard. Pulmonary/Chest: Effort normal and breath sounds normal. No stridor. No respiratory distress.  no wheezes.  no rales.  no tenderness.  Abdominal: Soft.  no distension.  no tenderness.  Musculoskeletal: Normal range of motion.  no  tenderness or deformity.  Neurological:  normal muscle tone. Coordination normal. No atrophy Skin: Skin is warm and dry. No rash noted. not diaphoretic.  Psychiatric:  normal mood and affect. behavior is normal. Thought content normal.   Recent Labs: No results found for requested labs within last 8760 hours.    Lipid Panel Lab Results  Component Value Date   CHOL 194 04/29/2017   HDL 48 04/29/2017   LDLCALC 108 (H) 04/29/2017   TRIG 190 (H) 04/29/2017      Wt Readings from Last 3 Encounters:  07/11/18 176 lb (79.8 kg)  02/28/18 175 lb (79.4 kg)  06/02/17 180 lb 9.6 oz (81.9 kg)       ASSESSMENT AND PLAN:  HYPERTENSION, BENIGN  Mildly elevated diastolic pressure today Recommended she monitor pressure at home No medication changes made  Other hyperlipidemia  on vytorin She prefers to have labwork done through endocrinology  Atypical chest pain Previous CT coronary calcium score 0 Prior stress test 2018 no ischemia   Total encounter time more than 25 minutes  Greater than 50% was spent in counseling and coordination of care with the patient    Disposition:   F/U  12 months   Orders Placed This Encounter  Procedures  . EKG 12-Lead     Signed, Esmond Plants, M.D., Ph.D. 07/11/2018  [place],  [place]

## 2018-07-11 ENCOUNTER — Encounter: Payer: Self-pay | Admitting: Cardiovascular Disease

## 2018-07-11 ENCOUNTER — Ambulatory Visit: Payer: BLUE CROSS/BLUE SHIELD | Admitting: Cardiovascular Disease

## 2018-07-11 VITALS — BP 120/90 | HR 56 | Ht 63.0 in | Wt 176.0 lb

## 2018-07-11 DIAGNOSIS — R079 Chest pain, unspecified: Secondary | ICD-10-CM

## 2018-07-11 DIAGNOSIS — I10 Essential (primary) hypertension: Secondary | ICD-10-CM

## 2018-07-11 DIAGNOSIS — R0602 Shortness of breath: Secondary | ICD-10-CM

## 2018-07-11 DIAGNOSIS — I872 Venous insufficiency (chronic) (peripheral): Secondary | ICD-10-CM

## 2018-07-11 DIAGNOSIS — E782 Mixed hyperlipidemia: Secondary | ICD-10-CM

## 2018-07-11 NOTE — Patient Instructions (Addendum)
Medication Instructions:   No medication changes made  Try tums Pepcid/zantac 1-2 every 12 hrs Gas-x as needed  Labwork:  No new labs needed  Testing/Procedures:  No further testing at this time   Follow-Up: It was a pleasure seeing you in the office today. Please call us if you have new issues that need to be addressed before your next appt.  [phone]  Your physician wants you to follow-up in: As needed  If you need a refill on your cardiac medications before your next appointment, please call your pharmacy.  For educational health videos Log in to : [email or web address] Or : [email or web address], password : [email or web address]

## 2018-07-21 DIAGNOSIS — Z23 Encounter for immunization: Secondary | ICD-10-CM | POA: Diagnosis not present

## 2018-07-27 DIAGNOSIS — J984 Other disorders of lung: Secondary | ICD-10-CM | POA: Diagnosis not present

## 2018-07-27 DIAGNOSIS — R0989 Other specified symptoms and signs involving the circulatory and respiratory systems: Secondary | ICD-10-CM | POA: Diagnosis not present

## 2018-07-27 DIAGNOSIS — E041 Nontoxic single thyroid nodule: Secondary | ICD-10-CM | POA: Diagnosis not present

## 2018-07-27 DIAGNOSIS — I10 Essential (primary) hypertension: Secondary | ICD-10-CM | POA: Diagnosis not present

## 2018-07-27 DIAGNOSIS — D352 Benign neoplasm of pituitary gland: Secondary | ICD-10-CM | POA: Diagnosis not present

## 2018-07-27 DIAGNOSIS — E7849 Other hyperlipidemia: Secondary | ICD-10-CM | POA: Diagnosis not present

## 2018-08-22 DIAGNOSIS — Z6831 Body mass index (BMI) 31.0-31.9, adult: Secondary | ICD-10-CM | POA: Diagnosis not present

## 2018-08-22 DIAGNOSIS — Z01419 Encounter for gynecological examination (general) (routine) without abnormal findings: Secondary | ICD-10-CM | POA: Diagnosis not present

## 2018-08-22 DIAGNOSIS — Z1231 Encounter for screening mammogram for malignant neoplasm of breast: Secondary | ICD-10-CM | POA: Diagnosis not present

## 2018-08-22 DIAGNOSIS — R87622 Low grade squamous intraepithelial lesion on cytologic smear of vagina (LGSIL): Secondary | ICD-10-CM | POA: Diagnosis not present

## 2018-08-22 DIAGNOSIS — Z1272 Encounter for screening for malignant neoplasm of vagina: Secondary | ICD-10-CM | POA: Diagnosis not present

## 2018-09-12 DIAGNOSIS — E042 Nontoxic multinodular goiter: Secondary | ICD-10-CM | POA: Diagnosis not present

## 2018-09-20 DIAGNOSIS — R87622 Low grade squamous intraepithelial lesion on cytologic smear of vagina (LGSIL): Secondary | ICD-10-CM | POA: Diagnosis not present

## 2018-09-20 DIAGNOSIS — Z6831 Body mass index (BMI) 31.0-31.9, adult: Secondary | ICD-10-CM | POA: Diagnosis not present

## 2018-10-31 DIAGNOSIS — Z86010 Personal history of colonic polyps: Secondary | ICD-10-CM | POA: Diagnosis not present

## 2018-11-24 DIAGNOSIS — J0100 Acute maxillary sinusitis, unspecified: Secondary | ICD-10-CM | POA: Diagnosis not present

## 2018-11-24 DIAGNOSIS — R0982 Postnasal drip: Secondary | ICD-10-CM | POA: Diagnosis not present

## 2018-12-06 DIAGNOSIS — Z23 Encounter for immunization: Secondary | ICD-10-CM | POA: Diagnosis not present

## 2018-12-11 IMAGING — US US CAROTID DUPLEX BILAT
1 series · 13 of 24 positions shown · non-contrast
Comparison: None.

CLINICAL DATA: Asymptomatic right carotid bruit, hypertension,
hyperlipidemia

EXAM:
BILATERAL CAROTID DUPLEX ULTRASOUND
TECHNIQUE: Gray scale imaging, color Doppler and duplex ultrasound were
performed of bilateral carotid and vertebral arteries in the neck.

[Series 1: us carotid duplex bilat · 0.06mm/px · 13 of 51 slices shown]
[im 1/51]
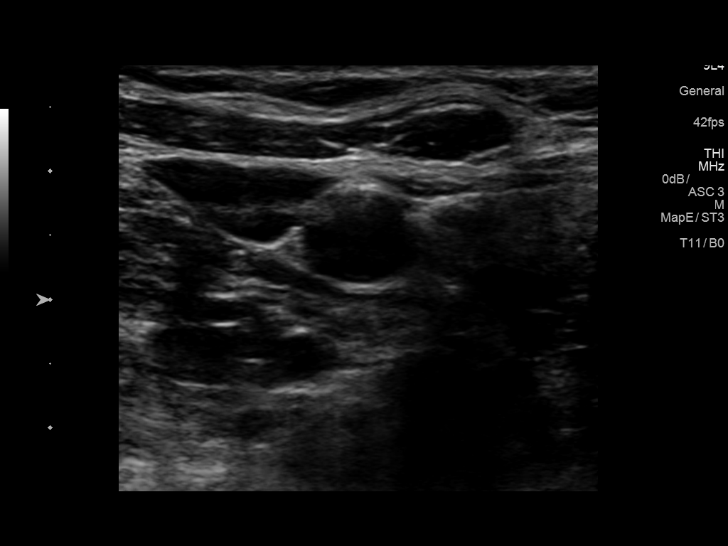
[im 5/51]
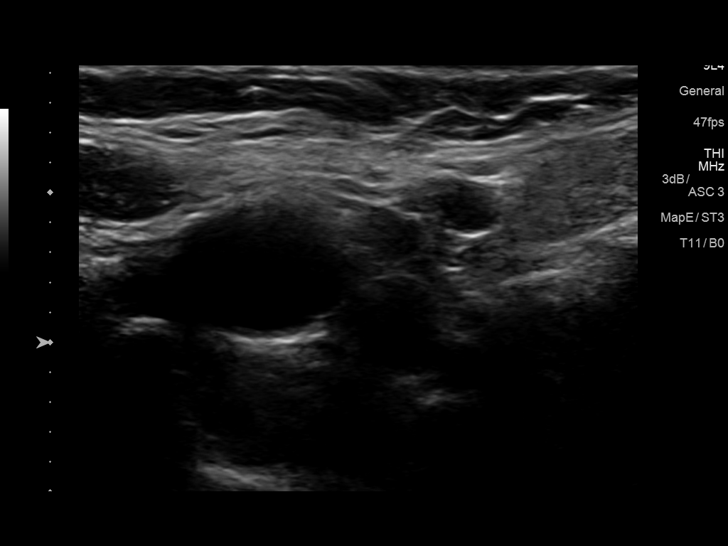
[im 9/51]
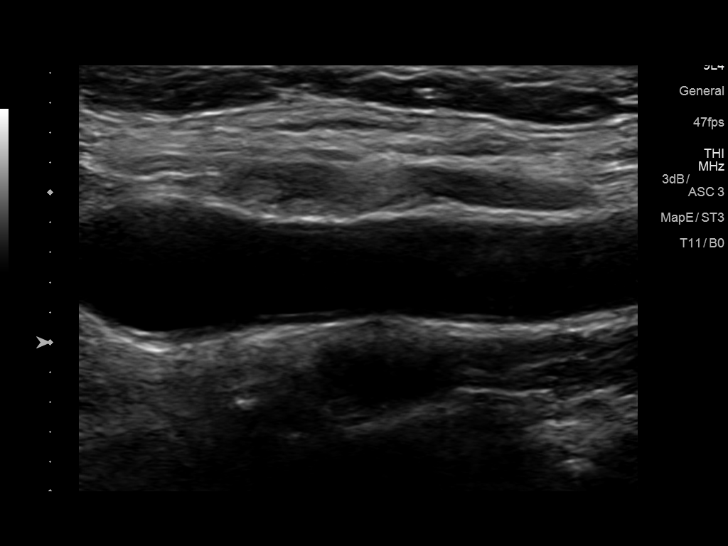
[im 14/51]
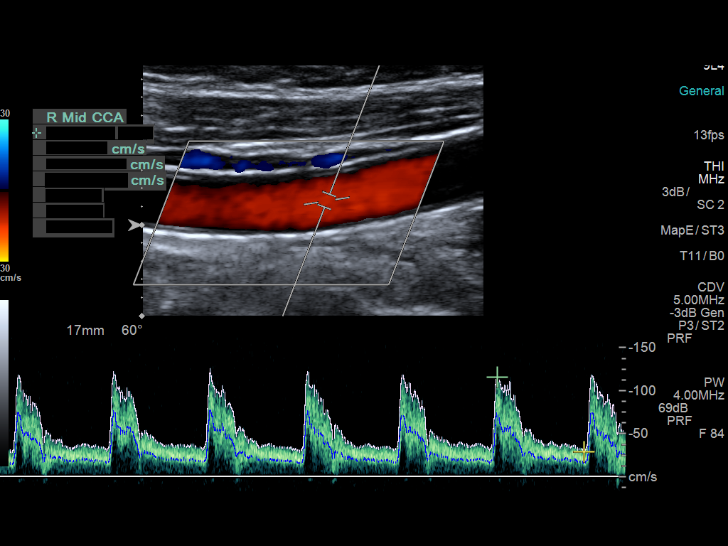
[im 18/51]
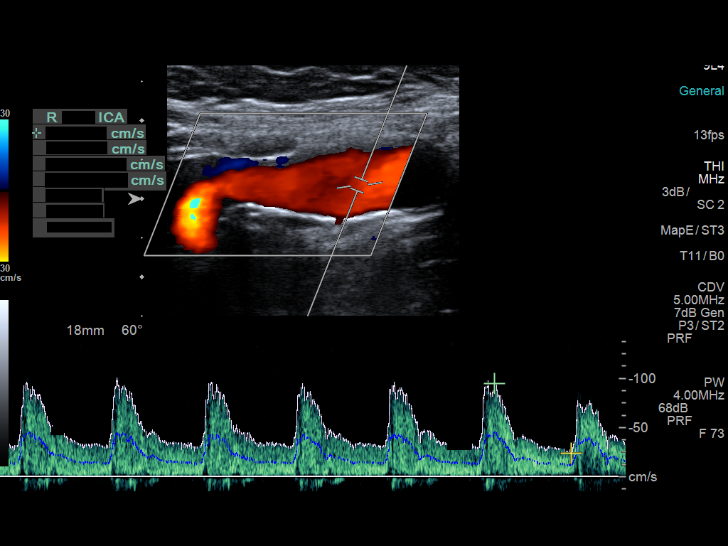
[im 22/51]
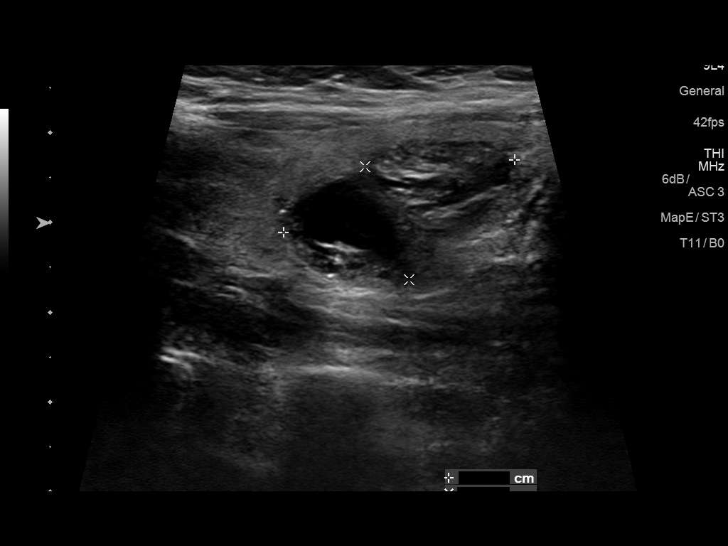
[im 27/51]
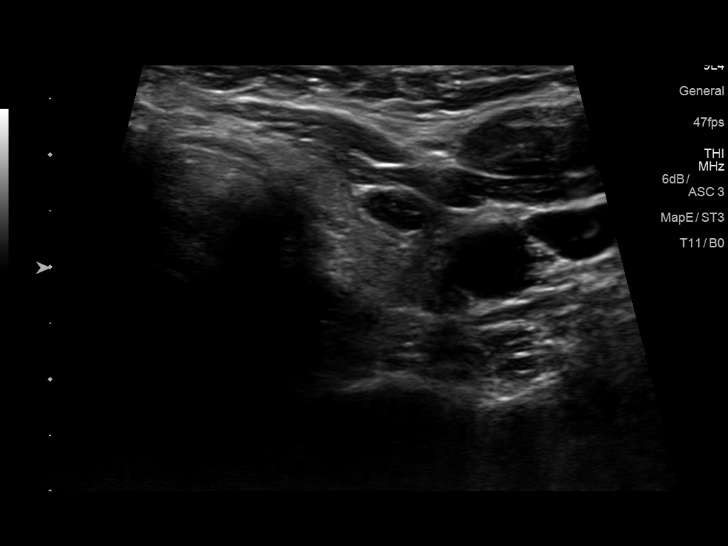
[im 29/51]
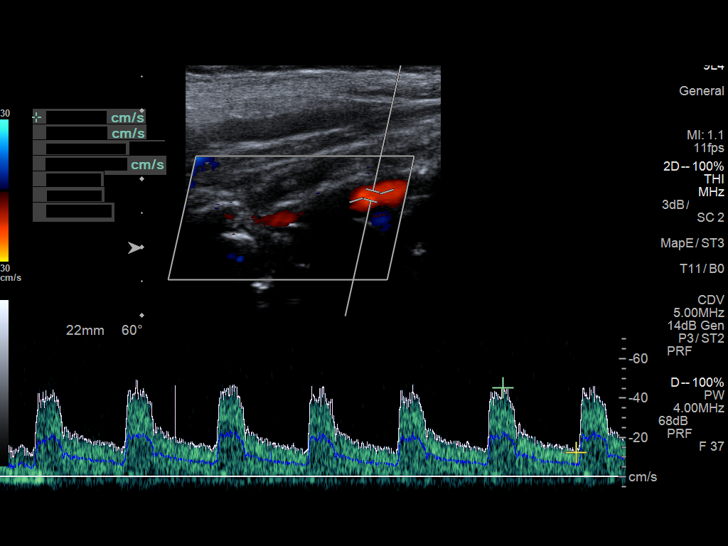
[im 33/51]
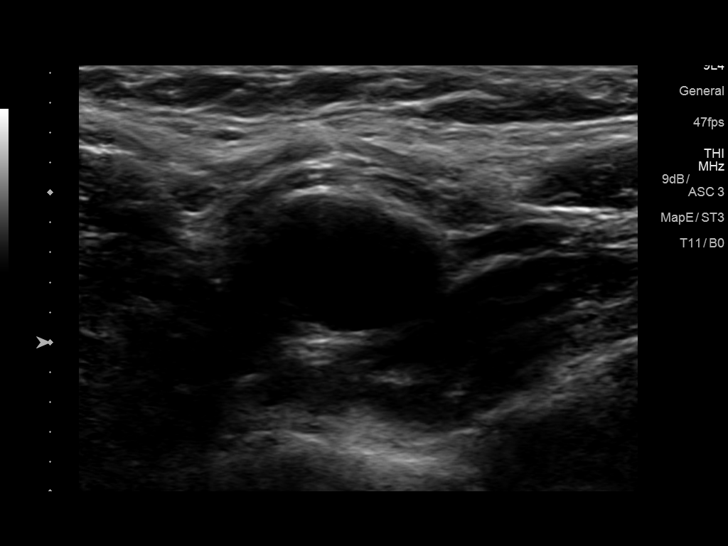
[im 37/51]
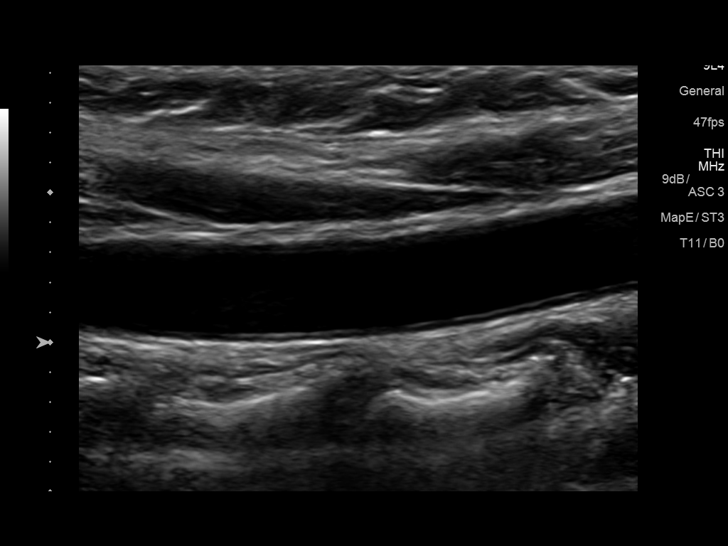
[im 42/51]
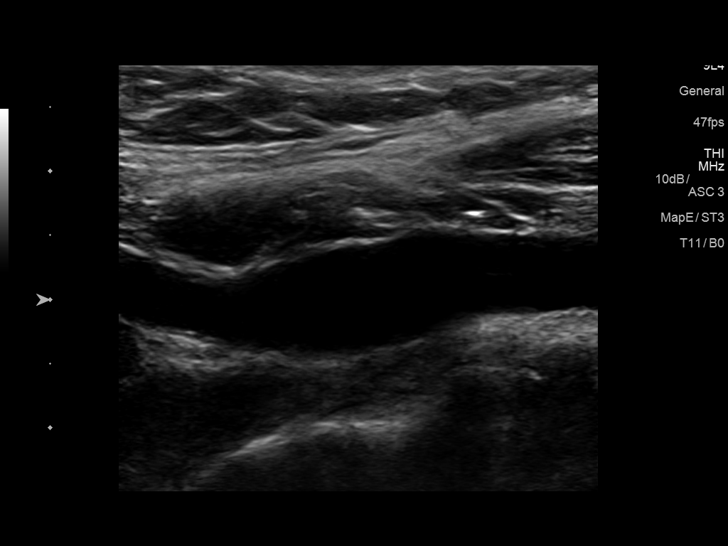
[im 46/51]
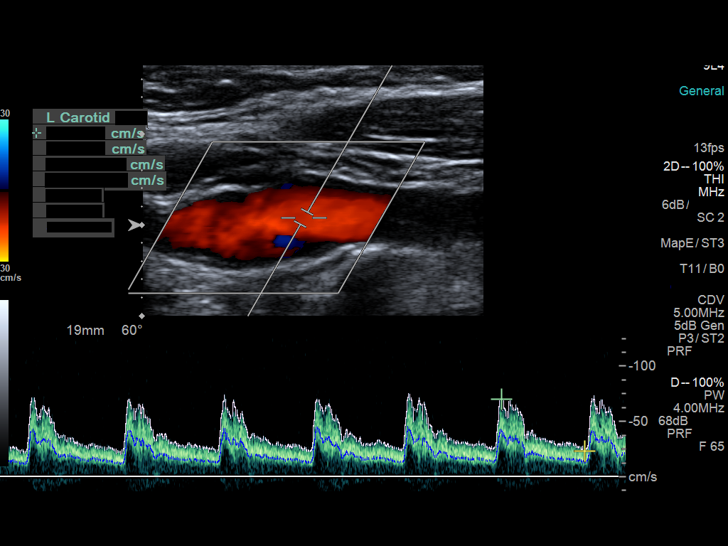
[im 51/51]
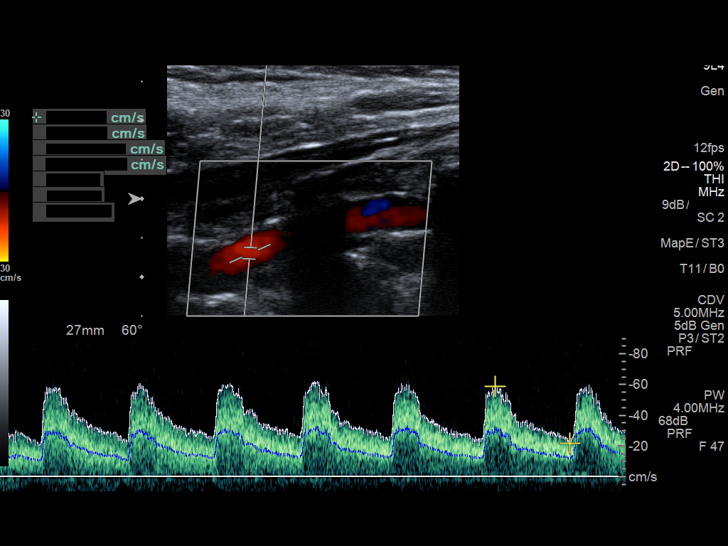

[13 of 24 positions shown; findings below may reference images not displayed]

FINDINGS: Criteria: Quantification of carotid stenosis is based on velocity
parameters that correlate the residual internal carotid diameter
with NASCET-based stenosis levels, using the diameter of the distal
internal carotid lumen as the denominator for stenosis measurement.

The following velocity measurements were obtained:

RIGHT

ICA:  95/24 cm/sec

CCA:  116/29 cm/sec

SYSTOLIC ICA/CCA RATIO:

DIASTOLIC ICA/CCA RATIO:

ECA:  78 cm/sec

LEFT

ICA:  116/28 cm/sec

CCA:  131/24 cm/sec

SYSTOLIC ICA/CCA RATIO:

DIASTOLIC ICA/CCA RATIO:

ECA:  95 cm/sec

RIGHT CAROTID ARTERY: Minor atherosclerotic plaque formation. No
hemodynamically significant right ICA stenosis, velocity elevation,
or turbulent flow. Degree of narrowing less than 50%.

RIGHT VERTEBRAL ARTERY:  Antegrade

LEFT CAROTID ARTERY: Similar scattered minor atherosclerotic plaque
formation. No hemodynamically significant left ICA stenosis,
velocity elevation, or turbulent flow.

LEFT VERTEBRAL ARTERY:  Antegrade.

Complex partially cystic right thyroid nodule measures 2.7 cm.
Recommend follow-up thyroid ultrasound for complete evaluation.
IMPRESSION: Mild carotid atherosclerosis. No hemodynamically significant ICA
stenosis. Degree of narrowing less than 50% bilaterally by
ultrasound criteria.

Patent antegrade vertebral flow bilaterally.

2.7 cm mixed cystic solid right thyroid nodule.

## 2019-01-20 DIAGNOSIS — L738 Other specified follicular disorders: Secondary | ICD-10-CM | POA: Diagnosis not present

## 2019-01-20 DIAGNOSIS — L820 Inflamed seborrheic keratosis: Secondary | ICD-10-CM | POA: Diagnosis not present

## 2019-01-20 DIAGNOSIS — D485 Neoplasm of uncertain behavior of skin: Secondary | ICD-10-CM | POA: Diagnosis not present

## 2019-01-20 DIAGNOSIS — L538 Other specified erythematous conditions: Secondary | ICD-10-CM | POA: Diagnosis not present

## 2019-01-31 ENCOUNTER — Encounter: Payer: Self-pay | Admitting: General Surgery

## 2019-01-31 ENCOUNTER — Ambulatory Visit: Payer: BLUE CROSS/BLUE SHIELD | Admitting: General Surgery

## 2019-01-31 ENCOUNTER — Other Ambulatory Visit: Payer: Self-pay

## 2019-01-31 VITALS — BP 148/89 | HR 61 | Temp 97.7°F | Resp 18 | Ht 63.0 in | Wt 184.2 lb

## 2019-01-31 DIAGNOSIS — D1722 Benign lipomatous neoplasm of skin and subcutaneous tissue of left arm: Secondary | ICD-10-CM | POA: Diagnosis not present

## 2019-01-31 NOTE — Patient Instructions (Addendum)
May shower May remove dressing in 2-3 days Steri strips will gradually come off over 2-3 weeks May use an Ice pack as needed for comfort  Call the office with any questions or new concerns.

## 2019-01-31 NOTE — Progress Notes (Signed)
Patient ID: Carla [name], female   DOB: July 09, 1961, 59 y.o.   MRN: [id number]  Chief Complaint  Patient presents with  . New Patient (Initial Visit)     new patient skin lesions referred by Dallie Dad    HPI Carla [name] is a 58 y.o. female here today as a new patient for skin lesions (lump under left armpit) referred by Dallie Dad. Patient state she is doing well.  HPI  Past Medical History:  Diagnosis Date  . Anxiety   . Depression   . HLD (hyperlipidemia)   . HTN (hypertension)   . Thyroid disease   . Venous insufficiency     Past Surgical History:  Procedure Laterality Date  . BIOPSY THYROID    . BUNIONECTOMY    . hysterctomy      Family History  Problem Relation Age of Onset  . CAD Father         CABG x 4 in his 66s, passed in his 74s  . Atrial fibrillation Father   . Hyperlipidemia Father   . Heart disease Father   . Cancer Mother        nasopharyngeal tumor, 37s  . Breast cancer Neg Hx   . Colon cancer Neg Hx     Social History Social History   Tobacco Use  . Smoking status: Never Smoker  . Smokeless tobacco: Never Used  Substance Use Topics  . Alcohol use: Yes    Alcohol/week: 1.0 standard drinks    Types: 1 Standard drinks or equivalent per week    Comment: rarely  . Drug use: No    No Known Allergies  Current Outpatient Medications  Medication Sig Dispense Refill  . aspirin 81 MG tablet Take 81 mg by mouth daily.      Marland Kitchen azelastine (OPTIVAR) 0.05 % ophthalmic solution     . calcium carbonate (OS-CAL) 600 MG TABS Take 600 mg by mouth daily.      . cefdinir (OMNICEF) 300 MG capsule     . estradiol (ESTRACE) 1 MG tablet Take 1 mg by mouth daily.     Marland Kitchen ezetimibe-simvastatin (VYTORIN) 10-40 MG tablet Take 1 tablet by mouth daily. 90 tablet 4  . Influenza vac split quadrivalent PF (AFLURIA QUADRIVALENT) 0.5 ML injection Afluria Qd 2019-20 (36 mos up)(PF)60 mcg (15 mcg x4)/0.5 mL IM syringe    . losartan (COZAAR) 100 MG tablet TAKE  1 TABLET DAILY. 90 tablet 3  . methylPREDNISolone (MEDROL DOSEPAK) 4 MG TBPK tablet     . Multiple Vitamin (MULTIVITAMIN) tablet Take 1 tablet by mouth daily.      . Omega-3 Fatty Acids (FISH OIL) 1200 MG CAPS Take 1 capsule by mouth daily.      . polyethylene glycol-electrolytes (NULYTELY/GOLYTELY) 420 g solution See admin instructions.    . vitamin E 400 UNIT capsule Take 400 Units by mouth daily.       No current facility-administered medications for this visit.     Review of Systems Review of Systems  Constitutional: Negative.   Respiratory: Negative.   Cardiovascular: Negative.     Blood pressure (!) 148/89, pulse 61, temperature 97.7 F (36.5 C), temperature source Temporal, resp. rate 18, height 5\' 3"  (1.6 m), weight 184 lb 3.2 oz (83.6 kg), last menstrual period 04/29/2002, SpO2 96 %.  Physical Exam Physical Exam Constitutional:      Appearance: She is well-developed.  Eyes:     General: No scleral icterus.    Conjunctiva/sclera: Conjunctivae normal.  Neck:     Musculoskeletal: Normal range of motion.  Cardiovascular:     Rate and Rhythm: Normal rate and regular rhythm.     Heart sounds: Normal heart sounds.  Pulmonary:     Effort: Pulmonary effort is normal.     Breath sounds: Normal breath sounds.  Chest:     Breasts: Breasts are symmetrical.        Right: No inverted nipple, mass, nipple discharge, skin change or tenderness.        Left: No inverted nipple, mass, nipple discharge, skin change or tenderness.  Musculoskeletal:       Arms:  Lymphadenopathy:     Cervical: No cervical adenopathy.  Skin:    General: Skin is warm and dry.  Neurological:     Mental Status: She is alert and oriented to person, place, and time.     Data Reviewed Due to the sensitivity in the area with direct pressure it was elected to proceed to excision.  The area was cleansed with alcohol followed by 10 cc of 0.5% Xylocaine with 0.25% Marcaine with 1 to 200,000 units of  epinephrine.  ChloraPrep was applied to the skin.  A less than 2 cm longitudinal incision was made over the lesion.  A mildly inflamed lipoma-like mass was excised.  Palpation through the defect showed no additional lesions.  The deep tissue was approximated with a single 4-0 Vicryl suture.  The skin was approximated with interrupted 4-0 Vicryl subcuticular sutures.  Benzoin and Steri-Strips followed by Telfa and Tegaderm dressing applied.  Ice pack provided.  Assessment Symptomatic lipoma of the left upper arm, excised.  Plan Wound care reviewed.  Patient will be contacted when pathology is available.     HPI, Physical Exam, Assessment and Plan have been scribed under the direction and in the presence of Hervey Ard, Md.  Eudelia Bunch R. Bobette Mo, CMA  I have completed the exam and reviewed the above documentation for accuracy and completeness.  I agree with the above.  Haematologist has been used and any errors in dictation or transcription are unintentional.  Hervey Ard, M.D., F.A.C.S.  Forest Gleason  02/01/2019, 10:31 AM

## 2019-02-01 DIAGNOSIS — D1722 Benign lipomatous neoplasm of skin and subcutaneous tissue of left arm: Secondary | ICD-10-CM | POA: Insufficient documentation

## 2019-02-06 NOTE — Progress Notes (Signed)
Patient has been informed of the results. Per Dr.Byrnett.

## 2019-03-01 ENCOUNTER — Telehealth: Payer: Self-pay | Admitting: *Deleted

## 2019-03-01 NOTE — Telephone Encounter (Signed)
Left message for patient to call us back.  

## 2019-03-01 NOTE — Telephone Encounter (Signed)
Patient states the lipoma area is very painful to touch. She would like to come in. Advised to [place] heat.

## 2019-03-01 NOTE — Telephone Encounter (Signed)
Patient called and stated that she had a lipoma removed under left armpit and she is still feeling some pain in that area, she wants to know if this is normal or should she be concerned?

## 2019-03-07 ENCOUNTER — Ambulatory Visit: Payer: BLUE CROSS/BLUE SHIELD | Admitting: General Surgery

## 2019-04-11 DIAGNOSIS — Z6831 Body mass index (BMI) 31.0-31.9, adult: Secondary | ICD-10-CM | POA: Diagnosis not present

## 2019-04-11 DIAGNOSIS — N890 Mild vaginal dysplasia: Secondary | ICD-10-CM | POA: Diagnosis not present

## 2019-04-11 DIAGNOSIS — R87622 Low grade squamous intraepithelial lesion on cytologic smear of vagina (LGSIL): Secondary | ICD-10-CM | POA: Diagnosis not present

## 2019-04-19 ENCOUNTER — Other Ambulatory Visit: Payer: Self-pay | Admitting: Cardiovascular Disease

## 2019-05-16 ENCOUNTER — Other Ambulatory Visit: Payer: Self-pay | Admitting: Cardiovascular Disease

## 2019-08-02 DIAGNOSIS — I10 Essential (primary) hypertension: Secondary | ICD-10-CM | POA: Diagnosis not present

## 2019-08-02 DIAGNOSIS — D352 Benign neoplasm of pituitary gland: Secondary | ICD-10-CM | POA: Diagnosis not present

## 2019-08-02 DIAGNOSIS — E785 Hyperlipidemia, unspecified: Secondary | ICD-10-CM | POA: Diagnosis not present

## 2019-08-02 DIAGNOSIS — E041 Nontoxic single thyroid nodule: Secondary | ICD-10-CM | POA: Diagnosis not present

## 2019-08-03 ENCOUNTER — Encounter: Payer: Self-pay | Admitting: Cardiovascular Disease

## 2019-08-03 DIAGNOSIS — E7849 Other hyperlipidemia: Secondary | ICD-10-CM | POA: Diagnosis not present

## 2019-08-03 DIAGNOSIS — Z23 Encounter for immunization: Secondary | ICD-10-CM | POA: Diagnosis not present

## 2019-08-03 DIAGNOSIS — I10 Essential (primary) hypertension: Secondary | ICD-10-CM | POA: Diagnosis not present

## 2019-08-03 DIAGNOSIS — R5381 Other malaise: Secondary | ICD-10-CM | POA: Diagnosis not present

## 2019-08-10 ENCOUNTER — Other Ambulatory Visit: Payer: Self-pay | Admitting: Cardiovascular Disease

## 2019-08-11 NOTE — Telephone Encounter (Signed)
Please call to schedule appointment. Last seen 06/2018. If patient does not wish to schedule please advise her to get refilled by PCP. Thank you!

## 2019-08-14 NOTE — Telephone Encounter (Signed)
Scheduled on 10/6 with [facility]

## 2019-08-28 NOTE — Progress Notes (Signed)
Cardiology Office Note  Date:  08/29/2019   ID:  Carla [name], DOB [birth year], MRN [id number]  PCP:  Olin Hauser, DO   Chief Complaint  Patient presents with  . Other    12 month follow up. patient denies chest pain and SOB at this time. Meds reviewed verbally with patient.     HPI:  Carla [name] is a 58 -year-old  woman with a history of  bradycardia,  hyperlipidemia,  hypertension  2.7 cm mixed cystic solid right thyroid nodule. Carotid: Minor atherosclerotic plaque formation  2018 who presents for routine followup of her hyperlipidemia and bradycardia  In follow-up reports she is doing well Works for a [employer] firm Has worked through the [employer]. No regular exercise program Has been referred to endocrinology for her thyroid nodule  Continues to take half dose of Vytorin No significant chest pain  Calcium score 0 June 2016 she had CT coronary calcium  Denies having significant leg swelling Tolerating losartan for blood pressure  EKG personally reviewed by myself on todays visit  shows normal sinus rhythm rate 62 bpm no significant ST or T-wave changes  Other past medical history Previously on Vytorin 5/20 milligrams daily, change to Lipitor for cost reasons Change to Crestor 10 mg daily Back to Vytorin  Other past medical history  Lost her father in September 2014.    04/26/17  chest pain/tightness  3 episodes Called ems EKG not acute BP was noted to be 170/90.  Stress test 04/2017: no ischemia  Carotid u/s Minor atherosclerotic plaque formation.  2.7 cm mixed cystic solid right thyroid nodule.  PMH:   has a past medical history of Anxiety, Depression, HLD (hyperlipidemia), HTN (hypertension), Thyroid disease, and Venous insufficiency.  PSH:    Past Surgical History:  Procedure Laterality Date  . BIOPSY THYROID    . BUNIONECTOMY    . hysterctomy      Current Outpatient Medications  Medication Sig Dispense Refill  .  aspirin 81 MG tablet Take 81 mg by mouth daily.      Marland Kitchen azelastine (OPTIVAR) 0.05 % ophthalmic solution     . calcium carbonate (OS-CAL) 600 MG TABS Take 600 mg by mouth daily.      Marland Kitchen estradiol (ESTRACE) 1 MG tablet Take 1 mg by mouth daily.     Marland Kitchen ezetimibe-simvastatin (VYTORIN) 10-40 MG tablet Take 1 tablet by mouth once daily 30 tablet 2  . losartan (COZAAR) 100 MG tablet TAKE 1 TABLET DAILY. 90 tablet 0  . Multiple Vitamin (MULTIVITAMIN) tablet Take 1 tablet by mouth daily.      . Omega-3 Fatty Acids (FISH OIL) 1200 MG CAPS Take 1 capsule by mouth daily.      . vitamin E 400 UNIT capsule Take 400 Units by mouth daily.       No current facility-administered medications for this visit.     Allergies:   Patient has no known allergies.   Social History:  The patient  reports that she has never smoked. She has never used smokeless tobacco. She reports current alcohol use of about 1.0 standard drinks of alcohol per week. She reports that she does not use drugs.   Family History:   family history includes Atrial fibrillation in her father; CAD in her father; Cancer in her mother; Heart disease in her father; Hyperlipidemia in her father.    Review of Systems: Review of Systems  Constitutional: Negative.   Respiratory: Negative.   Cardiovascular: Negative.   Gastrointestinal:  Negative.   Musculoskeletal: Negative.   Neurological: Negative.   Psychiatric/Behavioral: Negative.   All other systems reviewed and are negative.    PHYSICAL EXAM: VS:  BP 134/80 (BP Location: Left Arm, Patient Position: Sitting, Cuff Size: Normal)   Pulse 62   Ht 5\' 3"  (1.6 m)   Wt 186 lb 12 oz (84.7 kg)   LMP 04/29/2002 (Approximate) Comment: Hysterectomy 15 yrs ago per pt  BMI 33.08 kg/m  , BMI Body mass index is 33.08 kg/m. Constitutional:  oriented to person, place, and time. No distress.  HENT:  Head: Grossly normal Eyes:  no discharge. No scleral icterus.  Neck: No JVD, no carotid bruits   Cardiovascular: Regular rate and rhythm, no murmurs appreciated Pulmonary/Chest: Clear to auscultation bilaterally, no wheezes or rails Abdominal: Soft.  no distension.  no tenderness.  Musculoskeletal: Normal range of motion Neurological:  normal muscle tone. Coordination normal. No atrophy Skin: Skin warm and dry Psychiatric: normal affect, pleasant  Recent Labs: No results found for requested labs within last 8760 hours.    Lipid Panel Lab Results  Component Value Date   CHOL 194 04/29/2017   HDL 48 04/29/2017   LDLCALC 108 (H) 04/29/2017   TRIG 190 (H) 04/29/2017      Wt Readings from Last 3 Encounters:  08/29/19 186 lb 12 oz (84.7 kg)  01/31/19 184 lb 3.2 oz (83.6 kg)  07/11/18 176 lb (79.8 kg)       ASSESSMENT AND PLAN:  HYPERTENSION, BENIGN  Blood pressure is well controlled on today's visit. No changes made to the medications. Stable  Other hyperlipidemia  on vytorin, reports she is taking a half dose We have requested lab work from endocrinology , Dr. Forde Dandy Recommended regular walking program for weight loss Slow trend upwards over the past several years  Atypical chest pain Previous CT coronary calcium score 0 Prior stress test 2018 no ischemia No further work-up at this time   Total encounter time more than 25 minutes  Greater than 50% was spent in counseling and coordination of care with the patient    Disposition:   F/U  12 months   No orders of the defined types were placed in this encounter.    Signed, Esmond Plants, M.D., Ph.D. 08/29/2019  [place], [place]

## 2019-08-29 ENCOUNTER — Ambulatory Visit (INDEPENDENT_AMBULATORY_CARE_PROVIDER_SITE_OTHER): Payer: BC Managed Care – PPO | Admitting: Cardiovascular Disease

## 2019-08-29 ENCOUNTER — Other Ambulatory Visit: Payer: Self-pay

## 2019-08-29 ENCOUNTER — Encounter: Payer: Self-pay | Admitting: Cardiovascular Disease

## 2019-08-29 VITALS — BP 134/80 | HR 62 | Ht 63.0 in | Wt 186.8 lb

## 2019-08-29 DIAGNOSIS — R079 Chest pain, unspecified: Secondary | ICD-10-CM | POA: Diagnosis not present

## 2019-08-29 DIAGNOSIS — E782 Mixed hyperlipidemia: Secondary | ICD-10-CM | POA: Diagnosis not present

## 2019-08-29 DIAGNOSIS — R0602 Shortness of breath: Secondary | ICD-10-CM

## 2019-08-29 DIAGNOSIS — I10 Essential (primary) hypertension: Secondary | ICD-10-CM | POA: Diagnosis not present

## 2019-08-29 DIAGNOSIS — I872 Venous insufficiency (chronic) (peripheral): Secondary | ICD-10-CM

## 2019-08-29 MED ORDER — EZETIMIBE-SIMVASTATIN 10-40 MG PO TABS: 1.0000 | ORAL_TABLET | Freq: Every day | ORAL | 3 refills | Status: DC

## 2019-08-29 MED ORDER — LOSARTAN POTASSIUM 100 MG PO TABS: 100.0000 mg | ORAL_TABLET | Freq: Every day | ORAL | 3 refills | Status: DC

## 2019-08-29 NOTE — Patient Instructions (Signed)

## 2019-09-18 DIAGNOSIS — E042 Nontoxic multinodular goiter: Secondary | ICD-10-CM | POA: Diagnosis not present

## 2019-09-20 DIAGNOSIS — Z1231 Encounter for screening mammogram for malignant neoplasm of breast: Secondary | ICD-10-CM | POA: Diagnosis not present

## 2019-09-20 DIAGNOSIS — Z01419 Encounter for gynecological examination (general) (routine) without abnormal findings: Secondary | ICD-10-CM | POA: Diagnosis not present

## 2019-10-11 DIAGNOSIS — Z03818 Encounter for observation for suspected exposure to other biological agents ruled out: Secondary | ICD-10-CM | POA: Diagnosis not present

## 2019-12-17 ENCOUNTER — Other Ambulatory Visit: Payer: Self-pay | Admitting: Cardiovascular Disease

## 2020-01-28 DIAGNOSIS — Z20828 Contact with and (suspected) exposure to other viral communicable diseases: Secondary | ICD-10-CM | POA: Diagnosis not present

## 2020-08-07 DIAGNOSIS — D352 Benign neoplasm of pituitary gland: Secondary | ICD-10-CM | POA: Diagnosis not present

## 2020-08-07 DIAGNOSIS — E669 Obesity, unspecified: Secondary | ICD-10-CM | POA: Diagnosis not present

## 2020-08-07 DIAGNOSIS — Z23 Encounter for immunization: Secondary | ICD-10-CM | POA: Diagnosis not present

## 2020-08-07 DIAGNOSIS — I10 Essential (primary) hypertension: Secondary | ICD-10-CM | POA: Diagnosis not present

## 2020-08-07 DIAGNOSIS — E041 Nontoxic single thyroid nodule: Secondary | ICD-10-CM | POA: Diagnosis not present

## 2020-08-07 DIAGNOSIS — E7849 Other hyperlipidemia: Secondary | ICD-10-CM | POA: Diagnosis not present

## 2020-10-09 DIAGNOSIS — Z01419 Encounter for gynecological examination (general) (routine) without abnormal findings: Secondary | ICD-10-CM | POA: Diagnosis not present

## 2020-10-09 DIAGNOSIS — Z1231 Encounter for screening mammogram for malignant neoplasm of breast: Secondary | ICD-10-CM | POA: Diagnosis not present

## 2020-10-09 DIAGNOSIS — Z6833 Body mass index (BMI) 33.0-33.9, adult: Secondary | ICD-10-CM | POA: Diagnosis not present

## 2020-10-16 ENCOUNTER — Other Ambulatory Visit: Payer: Self-pay

## 2020-10-16 ENCOUNTER — Telehealth (INDEPENDENT_AMBULATORY_CARE_PROVIDER_SITE_OTHER): Payer: BC Managed Care – PPO | Admitting: Family Medicine

## 2020-10-16 ENCOUNTER — Encounter: Payer: Self-pay | Admitting: Family Medicine

## 2020-10-16 DIAGNOSIS — J0110 Acute frontal sinusitis, unspecified: Secondary | ICD-10-CM | POA: Diagnosis not present

## 2020-10-16 MED ORDER — AMOXICILLIN-POT CLAVULANATE 875-125 MG PO TABS: 1.0000 | ORAL_TABLET | Freq: Two times a day (BID) | ORAL | 0 refills | Status: DC

## 2020-10-16 NOTE — Progress Notes (Signed)
Virtual Visit via Telephone The purpose of this virtual visit is to provide medical care while limiting exposure to the novel coronavirus (COVID19) for both patient and office staff.  Consent was obtained for phone visit:  Yes.   Answered questions that patient had about telehealth interaction:  Yes.   I discussed the limitations, risks, security and privacy concerns of performing an evaluation and management service by telephone. I also discussed with the patient that there may be a patient responsible charge related to this service. The patient expressed understanding and agreed to proceed.  Patient Location: Home Provider Location: [facility] (Office)  Participants in virtual visit: - Patient: Carla [name] - CMA: Frederich Cha, CMA - Provider: Dr Parks Ranger  ---------------------------------------------------------------------- Chief Complaint  Patient presents with  . Sinus Problem    cough, onset 2 weeks denies SOB, chills     S: Reviewed CMA documentation. I have called patient and gathered additional HPI as follows:  Sinusitis Reports that symptoms started 2 weeks ago with some mild cough, and has worsened now with sinus congestion and pressure pain drainage. - Tried OTC Coricidin, antihistamine and flonase,   Denies any high risk travel to areas of current concern for COVID19. Denies any known or suspected exposure to person with or possibly with COVID19.  Admits cough, sinus pressure congestion Denies any fevers, chills, sweats, body ache, shortness of breath, headache, abdominal pain, diarrhea  Past Medical History:  Diagnosis Date  . Anxiety   . Depression   . HLD (hyperlipidemia)   . HTN (hypertension)   . Thyroid disease   . Venous insufficiency    Social History   Tobacco Use  . Smoking status: Never Smoker  . Smokeless tobacco: Never Used  Substance Use Topics  . Alcohol use: Yes    Alcohol/week: 1.0 standard drink    Types: 1  Standard drinks or equivalent per week    Comment: rarely  . Drug use: No    Current Outpatient Medications:  .  aspirin 81 MG tablet, Take 81 mg by mouth daily.  , Disp: , Rfl:  .  azelastine (OPTIVAR) 0.05 % ophthalmic solution, , Disp: , Rfl:  .  calcium carbonate (OS-CAL) 600 MG TABS, Take 600 mg by mouth daily.  , Disp: , Rfl:  .  estradiol (ESTRACE) 1 MG tablet, Take 1 mg by mouth daily. , Disp: , Rfl:  .  ezetimibe-simvastatin (VYTORIN) 10-40 MG tablet, Take 1 tablet by mouth once daily, Disp: 30 tablet, Rfl: 8 .  losartan (COZAAR) 100 MG tablet, Take 1 tablet (100 mg total) by mouth daily., Disp: 90 tablet, Rfl: 3 .  Multiple Vitamin (MULTIVITAMIN) tablet, Take 1 tablet by mouth daily.  , Disp: , Rfl:  .  Omega-3 Fatty Acids (FISH OIL) 1200 MG CAPS, Take 1 capsule by mouth daily.  , Disp: , Rfl:  .  vitamin E 400 UNIT capsule, Take 400 Units by mouth daily.  , Disp: , Rfl:  .  amoxicillin-clavulanate (AUGMENTIN) 875-125 MG tablet, Take 1 tablet by mouth 2 (two) times daily. For 10 days, Disp: 20 tablet, Rfl: 0  No flowsheet data found.  No flowsheet data found.  -------------------------------------------------------------------------- O: No physical exam performed due to remote telephone encounter.  Lab results reviewed.  No results found for this or any previous visit (from the past 2160 hour(s)).  -------------------------------------------------------------------------- A&P:  Problem List Items Addressed This Visit    None    Visit Diagnoses    Acute non-recurrent  frontal sinusitis    -  Primary   Relevant Medications   amoxicillin-clavulanate (AUGMENTIN) 875-125 MG tablet     Consistent with acute frontal sinusitis, likely initially viral URI vs allergic rhinitis component with worsening concern for bacterial infection.   UTD COVID Vaccination and booster.  Plan: 1. Start Augmentin 875-125mg  PO BID x 10 days 2. Continue Flonase, Anti histamine,  Coricidin  Return criteria reviewed   Meds ordered this encounter  Medications  . amoxicillin-clavulanate (AUGMENTIN) 875-125 MG tablet    Sig: Take 1 tablet by mouth 2 (two) times daily. For 10 days    Dispense:  20 tablet    Refill:  0    Follow-up: - Return in 1-2 weeks as needed if sinus not improved  Patient verbalizes understanding with the above medical recommendations including the limitation of remote medical advice.  Specific follow-up and call-back criteria were given for patient to follow-up or seek medical care more urgently if needed.   - Time spent in direct consultation with patient on phone: 8 minutes   Nobie Putnam, [facility] 10/16/2020, 10:17 AM

## 2020-10-16 NOTE — Patient Instructions (Addendum)
Start Augmentin antibiotic for 10 days.  Keep on Coricidin, Flonase, Anti histamine.  Please schedule a Follow-up Appointment to: Return in about 2 weeks (around 10/30/2020), or if symptoms worsen or fail to improve, for 2 weeks as needed if not improved.  If you have any other questions or concerns, please feel free to call the office or send a message through [place]. You may also schedule an earlier appointment if necessary.  Additionally, you may be receiving a survey about your experience at our office within a few days to 1 week by e-mail or mail. We value your feedback.  Nobie Putnam, DO [place]

## 2020-11-07 ENCOUNTER — Other Ambulatory Visit: Payer: Self-pay | Admitting: Cardiovascular Disease

## 2020-11-07 NOTE — Telephone Encounter (Signed)
Please call pt to schedule overdue follow-up appointment with Dr. Gollan for refills. Thanks! 

## 2020-11-07 NOTE — Telephone Encounter (Signed)
Patient already scheduled jan 2022

## 2020-11-07 NOTE — Telephone Encounter (Signed)
Rx request sent to pharmacy.  

## 2020-11-25 NOTE — Progress Notes (Signed)
Cardiology Office Note  Date:  11/26/2020   ID:  Carla [name], DOB [birth year], MRN [id number]  PCP:  [name]   Chief Complaint  Patient presents with  . 12 month F/U-No new cardiac concerns    HPI:  Carla [name] is a 60 -year-old  woman with a history of  bradycardia,  hyperlipidemia,  hypertension  2.7 cm mixed cystic solid right thyroid nodule. 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  Doing some exercise Treadmill 1-3x a week  Has been referred to endocrinology for her thyroid nodule  Continues to take half dose of Vytorin No significant chest pain  Calcium score 0 June 2016 she had CT coronary calcium  Denies having significant leg swelling Tolerating losartan for blood pressure  EKG personally reviewed by myself on todays visit  shows normal sinus rhythm rate 62 bpm no significant ST or T-wave changes  Other past medical history Previously on Vytorin 5/20 milligrams daily, change to Lipitor for cost reasons Change to Crestor 10 mg daily Back to Vytorin  Other past medical history  Lost her father in September 2014.    04/26/17  chest pain/tightness  3 episodes Called ems EKG not acute BP was noted to be 170/90.  Stress test 04/2017: no ischemia  Carotid u/s Minor atherosclerotic plaque formation.  2.7 cm mixed cystic solid right thyroid nodule.  PMH:   has a past medical history of Anxiety, Depression, HLD (hyperlipidemia), HTN (hypertension), Thyroid disease, and Venous insufficiency.  PSH:    Past Surgical History:  Procedure Laterality Date  . BIOPSY THYROID    . BUNIONECTOMY    . hysterctomy      Current Outpatient Medications  Medication Sig Dispense Refill  . aspirin 81 MG tablet Take 81 mg by mouth daily.    . calcium  carbonate (OS-CAL) 600 MG TABS Take 600 mg by mouth daily.    Marland Kitchen estradiol (ESTRACE) 1 MG tablet Take 1 mg by mouth daily.     Marland Kitchen ezetimibe-simvastatin (VYTORIN) 10-40 MG tablet Take 1 tablet by mouth once daily 30 tablet 8  . Lifitegrast (XIIDRA) 5 % SOLN Apply 1 drop to eye 2 (two) times daily.    Marland Kitchen losartan (COZAAR) 100 MG tablet Take 1 tablet (100 mg total) by mouth daily. 90 tablet 1  . Multiple Vitamin (MULTIVITAMIN) tablet Take 1 tablet by mouth daily.    . Omega-3 Fatty Acids (FISH OIL) 1200 MG CAPS Take 1 capsule by mouth daily.    . Polyvinyl Alcohol-Povidone (TEARS PLUS OP) Apply 1 drop to eye 2 (two) times daily.    . vitamin E 400 UNIT capsule Take 400 Units by mouth daily.     No current facility-administered medications for this visit.    Allergies:   Patient has no known allergies.   Social History:  The patient  reports that she has never smoked. She has never used smokeless tobacco. She reports current alcohol use of about 1.0 standard drink of alcohol per week. She reports that she does not use drugs.   Family History:   family history includes Atrial fibrillation in her father; CAD in her father; Cancer in her mother; Heart disease in her father; Hyperlipidemia in her father.    Review of Systems: Review of Systems  Constitutional: Negative.   Respiratory: Negative.  Cardiovascular: Negative.   Gastrointestinal: Negative.   Musculoskeletal: Negative.   Neurological: Negative.   Psychiatric/Behavioral: Negative.   All other systems reviewed and are negative.    PHYSICAL EXAM: VS:  BP 120/80 (BP Location: Left Arm, Patient Position: Sitting, Cuff Size: Normal)   Pulse 72   Ht 5\' 2"  (1.575 m)   Wt 186 lb (84.4 kg)   LMP 04/29/2002 (Approximate) Comment: Hysterectomy 15 yrs ago per pt  SpO2 95%   BMI 34.02 kg/m  , BMI Body mass index is 34.02 kg/m. Constitutional:  oriented to person, place, and time. No distress.  HENT:  Head: Grossly normal Eyes:  no  discharge. No scleral icterus.  Neck: No JVD, no carotid bruits  Cardiovascular: Regular rate and rhythm, no murmurs appreciated Pulmonary/Chest: Clear to auscultation bilaterally, no wheezes or rails Abdominal: Soft.  no distension.  no tenderness.  Musculoskeletal: Normal range of motion Neurological:  normal muscle tone. Coordination normal. No atrophy Skin: Skin warm and dry Psychiatric: normal affect, pleasant  Recent Labs: No results found for requested labs within last 8760 hours.    Lipid Panel Lab Results  Component Value Date   CHOL 194 04/29/2017   HDL 48 04/29/2017   LDLCALC 108 (H) 04/29/2017   TRIG 190 (H) 04/29/2017      Wt Readings from Last 3 Encounters:  11/26/20 186 lb (84.4 kg)  08/29/19 186 lb 12 oz (84.7 kg)  01/31/19 184 lb 3.2 oz (83.6 kg)       ASSESSMENT AND PLAN:  HYPERTENSION, BENIGN  Blood pressure is well controlled on today's visit. No changes made to the medications.  Other hyperlipidemia  on vytorin,taking a half dose Total cholesterol around 170-1 80 We have requested lab work from endocrinology , Carla [name] She will continue to walk on treadmill  Atypical chest pain Previous CT coronary calcium score 0 Prior stress test 2018 no ischemia No further work-up at this time  Bradycardia No recent symptoms, Rate well controlled    Total encounter time more than 25 minutes  Greater than 50% was spent in counseling and coordination of care with the patient    No orders of the defined types were placed in this encounter.    Signed, Esmond Plants, M.D., Ph.D. 11/26/2020  [place], [place]

## 2020-11-26 ENCOUNTER — Encounter: Payer: Self-pay | Admitting: Cardiovascular Disease

## 2020-11-26 ENCOUNTER — Ambulatory Visit: Payer: BC Managed Care – PPO | Admitting: Cardiovascular Disease

## 2020-11-26 ENCOUNTER — Other Ambulatory Visit: Payer: Self-pay

## 2020-11-26 VITALS — BP 120/80 | HR 72 | Ht 62.0 in | Wt 186.0 lb

## 2020-11-26 DIAGNOSIS — I10 Essential (primary) hypertension: Secondary | ICD-10-CM | POA: Diagnosis not present

## 2020-11-26 DIAGNOSIS — E782 Mixed hyperlipidemia: Secondary | ICD-10-CM

## 2020-11-26 DIAGNOSIS — R079 Chest pain, unspecified: Secondary | ICD-10-CM | POA: Diagnosis not present

## 2020-11-26 DIAGNOSIS — I872 Venous insufficiency (chronic) (peripheral): Secondary | ICD-10-CM

## 2020-11-26 DIAGNOSIS — R0602 Shortness of breath: Secondary | ICD-10-CM | POA: Diagnosis not present

## 2020-11-26 MED ORDER — EZETIMIBE-SIMVASTATIN 10-40 MG PO TABS: 1.0000 | ORAL_TABLET | Freq: Every day | ORAL | 3 refills | Status: DC

## 2020-11-26 MED ORDER — LOSARTAN POTASSIUM 100 MG PO TABS: 100.0000 mg | ORAL_TABLET | Freq: Every day | ORAL | 3 refills | Status: DC

## 2020-11-26 NOTE — Patient Instructions (Addendum)
We will try to obtain labs from Dr. Evlyn Kanner, [facility] In [place]-consent was signed at visit, will fax request  Medication Instructions:  Ok to hold aspirin  Drink sips of water or other source of fluids while while eating Consider a Proton pump inhibitors (PPIs), omeprazole can get over the counter) if throat sticking continues    If you need a refill on your cardiac medications before your next appointment, please call your pharmacy.    Lab work: No new labs needed   If you have labs (blood work) drawn today and your tests are completely normal, you will receive your results only by: Marland Kitchen MyChart Message (if you have MyChart) OR . A paper copy in the mail If you have any lab test that is abnormal or we need to change your treatment, we will call you to review the results.   Testing/Procedures: No new testing needed   Follow-Up: At [facility], you and your health needs are our priority.  As part of our continuing mission to provide you with exceptional heart care, we have created designated Provider Care Teams.  These Care Teams include your primary Cardiologist (physician) and Advanced Practice Providers (APPs -  Physician Assistants and Nurse Practitioners) who all work together to provide you with the care you need, when you need it.  . You will need a follow up appointment in 12 months  . Providers on your designated Care Team:   . Nicolasa Ducking, NP . Eula Listen, PA-C . Marisue Ivan, PA-C  Any Other Special Instructions Will Be Listed Below (If Applicable).  COVID-19 Vaccine Information can be found at: PodExchange.nl For questions related to vaccine distribution or appointments, please email [email or web address] or call [phone].

## 2021-03-18 DIAGNOSIS — X32XXXA Exposure to sunlight, initial encounter: Secondary | ICD-10-CM | POA: Diagnosis not present

## 2021-03-18 DIAGNOSIS — L718 Other rosacea: Secondary | ICD-10-CM | POA: Diagnosis not present

## 2021-03-18 DIAGNOSIS — D2261 Melanocytic nevi of right upper limb, including shoulder: Secondary | ICD-10-CM | POA: Diagnosis not present

## 2021-03-18 DIAGNOSIS — D2262 Melanocytic nevi of left upper limb, including shoulder: Secondary | ICD-10-CM | POA: Diagnosis not present

## 2021-03-18 DIAGNOSIS — L570 Actinic keratosis: Secondary | ICD-10-CM | POA: Diagnosis not present

## 2021-03-18 DIAGNOSIS — D225 Melanocytic nevi of trunk: Secondary | ICD-10-CM | POA: Diagnosis not present

## 2021-03-25 ENCOUNTER — Telehealth (INDEPENDENT_AMBULATORY_CARE_PROVIDER_SITE_OTHER): Payer: BC Managed Care – PPO | Admitting: Family Medicine

## 2021-03-25 ENCOUNTER — Encounter: Payer: Self-pay | Admitting: Family Medicine

## 2021-03-25 ENCOUNTER — Other Ambulatory Visit: Payer: Self-pay

## 2021-03-25 VITALS — Ht 62.0 in | Wt 186.0 lb

## 2021-03-25 DIAGNOSIS — J0110 Acute frontal sinusitis, unspecified: Secondary | ICD-10-CM

## 2021-03-25 MED ORDER — AMOXICILLIN-POT CLAVULANATE 875-125 MG PO TABS: 1.0000 | ORAL_TABLET | Freq: Two times a day (BID) | ORAL | 0 refills | Status: DC

## 2021-03-25 NOTE — Patient Instructions (Signed)
  1. It sounds like you have a Sinusitis (Bacterial Infection) - this most likely started as an Upper Respiratory Virus that has settled into an infection. Allergies can also cause this. - Start Augmentin 1 pill twice daily (breakfast and dinner, with food and plenty of water) for 10 days, complete entire course, do not stop early even if feeling better - Start Start nasal steroid Flonase 2 sprays in each nostril daily for 4-6 weeks, may repeat course seasonally or as needed - Improve hydration by drinking plenty of clear fluids (water, gatorade) to reduce secretions and thin congestion - Congestion draining down throat can cause irritation. May try warm herbal tea with honey, cough drops - Can take Tylenol or Ibuprofen as needed for fevers - May continue over the counter cold medicine as you are, I would not use any decongestant or mucinex longer than 7 days.  If you develop persistent fever >101F for at least 3 consecutive days, headaches with sinus pain or pressure or persistent earache, please schedule a follow-up evaluation within next few days to week.   Please schedule a Follow-up Appointment to: Return if symptoms worsen or fail to improve.  If you have any other questions or concerns, please feel free to call the office or send a message through [place]. You may also schedule an earlier appointment if necessary.  Additionally, you may be receiving a survey about your experience at our office within a few days to 1 week by e-mail or mail. We value your feedback.  Nobie Putnam, DO [place]

## 2021-03-25 NOTE — Progress Notes (Signed)
Virtual Visit via Telephone The purpose of this virtual visit is to provide medical care while limiting exposure to the novel coronavirus (COVID19) for both patient and office staff.  Consent was obtained for phone visit:  Yes.   Answered questions that patient had about telehealth interaction:  Yes.   I discussed the limitations, risks, security and privacy concerns of performing an evaluation and management service by telephone. I also discussed with the patient that there may be a patient responsible charge related to this service. The patient expressed understanding and agreed to proceed.  Patient Location: Home Provider Location: [facility] (Office)  Participants in virtual visit: - Patient: Carla [name] - CMA: Orinda Kenner, CMA - Provider: Dr Parks Ranger  ---------------------------------------------------------------------- Chief Complaint  Patient presents with  . Nasal Congestion  . Cough  . Ear Fullness    S: Reviewed CMA documentation. I have called patient and gathered additional HPI as follows:  Sinusitis Reports that symptoms started 1 week ago with sinus congestion, mucus production, tried claritin OTC allergy cough sinus medicine, she has switched to mucinex-DM, also tried Sudafed caused her to be on edge or jittery. Admits worse sinus pressure and some ear pressure fullness with popping. - Home COVID19 test negative. - Has some Flonase but not using regularly, just started using.  Denies any known or suspected exposure to person with or possibly with COVID19.  Denies any fevers, chills, sweats, body ache, shortness of breath, abdominal pain, diarrhea  Past Medical History:  Diagnosis Date  . Anxiety   . Depression   . HLD (hyperlipidemia)   . HTN (hypertension)   . Thyroid disease   . Venous insufficiency    Social History   Tobacco Use  . Smoking status: Never Smoker  . Smokeless tobacco: Never Used  Vaping Use  . Vaping Use:  Never used  Substance Use Topics  . Alcohol use: Yes    Alcohol/week: 1.0 standard drink    Types: 1 Standard drinks or equivalent per week    Comment: rarely  . Drug use: No    Current Outpatient Medications:  .  amoxicillin-clavulanate (AUGMENTIN) 875-125 MG tablet, Take 1 tablet by mouth 2 (two) times daily., Disp: 20 tablet, Rfl: 0 .  aspirin 81 MG tablet, Take 81 mg by mouth daily., Disp: , Rfl:  .  calcium carbonate (OS-CAL) 600 MG TABS, Take 600 mg by mouth daily., Disp: , Rfl:  .  estradiol (ESTRACE) 1 MG tablet, Take 1 mg by mouth daily. , Disp: , Rfl:  .  ezetimibe-simvastatin (VYTORIN) 10-40 MG tablet, Take 1 tablet by mouth daily., Disp: 90 tablet, Rfl: 3 .  Lifitegrast (XIIDRA) 5 % SOLN, Apply 1 drop to eye 2 (two) times daily., Disp: , Rfl:  .  losartan (COZAAR) 100 MG tablet, Take 1 tablet (100 mg total) by mouth daily., Disp: 90 tablet, Rfl: 3 .  Multiple Vitamin (MULTIVITAMIN) tablet, Take 1 tablet by mouth daily., Disp: , Rfl:  .  Omega-3 Fatty Acids (FISH OIL) 1200 MG CAPS, Take 1 capsule by mouth daily., Disp: , Rfl:  .  Polyvinyl Alcohol-Povidone (TEARS PLUS OP), Apply 1 drop to eye 2 (two) times daily., Disp: , Rfl:  .  vitamin E 400 UNIT capsule, Take 400 Units by mouth daily., Disp: , Rfl:   No flowsheet data found.  No flowsheet data found.  -------------------------------------------------------------------------- O: No physical exam performed due to remote telephone encounter.  Lab results reviewed.  No results found for  this or any previous visit (from the past 2160 hour(s)).  -------------------------------------------------------------------------- A&P:  Problem List Items Addressed This Visit   None   Visit Diagnoses    Acute non-recurrent frontal sinusitis    -  Primary   Relevant Medications   amoxicillin-clavulanate (AUGMENTIN) 875-125 MG tablet     Consistent with acute frontal rhinosinusitis, likely initially viral URI vs allergic  rhinitis component with worsening concern for bacterial infection.   Plan: 1 Start Augmentin 875-125mg  PO BID x 10 days 2. Continue Zyrtec, Mucinex for up to 7-10 days,  3. Start nasal steroid Flonase 2 sprays in each nostril daily for 4-6 weeks, may repeat course seasonally or as needed 4. Consider cough med OTC or can add prednisone in future if not improved 48 hours Return criteria reviewed   Meds ordered this encounter  Medications  . amoxicillin-clavulanate (AUGMENTIN) 875-125 MG tablet    Sig: Take 1 tablet by mouth 2 (two) times daily.    Dispense:  20 tablet    Refill:  0    Follow-up: - Return in 1 week if not improved  Patient verbalizes understanding with the above medical recommendations including the limitation of remote medical advice.  Specific follow-up and call-back criteria were given for patient to follow-up or seek medical care more urgently if needed.   - Time spent in direct consultation with patient on phone: 10 minutes  Nobie Putnam, [facility] 03/25/2021, 4:08 PM

## 2021-04-11 ENCOUNTER — Telehealth: Payer: Self-pay

## 2021-04-11 DIAGNOSIS — T3695XA Adverse effect of unspecified systemic antibiotic, initial encounter: Secondary | ICD-10-CM

## 2021-04-11 DIAGNOSIS — B379 Candidiasis, unspecified: Secondary | ICD-10-CM

## 2021-04-11 MED ORDER — FLUCONAZOLE 150 MG PO TABS: ORAL_TABLET | ORAL | 1 refills | Status: DC

## 2021-04-11 NOTE — Telephone Encounter (Signed)
Pt called stating that she has developed a yeast infection from a recent antibiotic and would like something called in for it. Please advise

## 2021-04-11 NOTE — Telephone Encounter (Signed)
We treated her recently for sinuses in past 2-3 weeks with antibiotic. I am okay to prescribe Diflucan for yeast since we have had a recent visit.  Can you confirm which pharmacy? The chart says [facility] but the last rx she requested we send to [facility].  Nobie Putnam, [facility] 04/11/2021, 11:56 AM

## 2021-04-11 NOTE — Telephone Encounter (Signed)
[facility] please

## 2021-04-17 DIAGNOSIS — D1722 Benign lipomatous neoplasm of skin and subcutaneous tissue of left arm: Secondary | ICD-10-CM | POA: Diagnosis not present

## 2021-04-17 DIAGNOSIS — D1720 Benign lipomatous neoplasm of skin and subcutaneous tissue of unspecified limb: Secondary | ICD-10-CM | POA: Diagnosis not present

## 2021-04-22 LAB — SURGICAL PATHOLOGY

## 2021-08-11 DIAGNOSIS — H1045 Other chronic allergic conjunctivitis: Secondary | ICD-10-CM | POA: Diagnosis not present

## 2021-08-11 DIAGNOSIS — H04123 Dry eye syndrome of bilateral lacrimal glands: Secondary | ICD-10-CM | POA: Diagnosis not present

## 2021-08-11 DIAGNOSIS — H5200 Hypermetropia, unspecified eye: Secondary | ICD-10-CM | POA: Diagnosis not present

## 2021-08-11 DIAGNOSIS — H02889 Meibomian gland dysfunction of unspecified eye, unspecified eyelid: Secondary | ICD-10-CM | POA: Diagnosis not present

## 2021-08-14 DIAGNOSIS — E785 Hyperlipidemia, unspecified: Secondary | ICD-10-CM | POA: Diagnosis not present

## 2021-08-14 DIAGNOSIS — E041 Nontoxic single thyroid nodule: Secondary | ICD-10-CM | POA: Diagnosis not present

## 2021-08-14 DIAGNOSIS — I10 Essential (primary) hypertension: Secondary | ICD-10-CM | POA: Diagnosis not present

## 2021-08-14 DIAGNOSIS — E221 Hyperprolactinemia: Secondary | ICD-10-CM | POA: Diagnosis not present

## 2021-08-18 ENCOUNTER — Other Ambulatory Visit: Payer: Self-pay | Admitting: Cardiovascular Disease

## 2021-09-03 DIAGNOSIS — E042 Nontoxic multinodular goiter: Secondary | ICD-10-CM | POA: Diagnosis not present

## 2021-10-20 ENCOUNTER — Ambulatory Visit: Payer: Self-pay

## 2021-10-20 NOTE — Telephone Encounter (Signed)
Reason for Disposition  [1] Symptoms of anxiety or panic attack AND [2] is a chronic symptom (recurrent or ongoing AND present > 4 weeks)  Answer Assessment - Initial Assessment Questions 1. CONCERN: "Did anything happen that prompted you to call today?"      anxiety 2. ANXIETY SYMPTOMS: "Can you describe how you (your loved one; patient) have been feeling?" (e.g., tense, restless, panicky, anxious, keyed up, overwhelmed, sense of impending doom).      Tense,panicky,anxious,keyed up,overwhelmed 3. ONSET: "How long have you been feeling this way?" (e.g., hours, days, weeks)     1 month 4. SEVERITY: "How would you rate the level of anxiety?" (e.g., 0 - 10; or mild, moderate, severe).     moderate 5. FUNCTIONAL IMPAIRMENT: "How have these feelings affected your ability to do daily activities?" "Have you had more difficulty than usual doing your normal daily activities?" (e.g., getting better, same, worse; self-care, school, work, interactions)     No change,no 6. HISTORY: "Have you felt this way before?" "Have you ever been diagnosed with an anxiety problem in the past?" (e.g., generalized anxiety disorder, panic attacks, PTSD). If Yes, ask: "How was this problem treated?" (e.g., medicines, counseling, etc.)     Yes- yes-med for anxiety 7. RISK OF HARM - SUICIDAL IDEATION: "Do you ever have thoughts of hurting or killing yourself?" If Yes, ask:  "Do you have these feelings now?" "Do you have a plan on how you would do this?"     no 8. TREATMENT:  "What has been done so far to treat this anxiety?" (e.g., medicines, relaxation strategies). "What has helped?"     exercising 9. TREATMENT - THERAPIST: "Do you have a counselor or therapist? Name?"     no 10. POTENTIAL TRIGGERS: "Do you drink caffeinated beverages (e.g., coffee, colas, teas), and how much daily?" "Do you drink alcohol or use any drugs?" "Have you started any new medicines recently?"     Kitchen remodelded 10. PATIENT SUPPORT: "Who is  with you now?" "Who do you live with?" "Do you have family or friends who you can talk to?"        Work- husband-yes 43. OTHER SYMPTOMS: "Do you have any other symptoms?" (e.g., feeling depressed, trouble concentrating, trouble sleeping, trouble breathing, palpitations or fast heartbeat, chest pain, sweating, nausea, or diarrhea)       Upper chest feeling anxious not pain or pressure- 12. PREGNANCY: "Is there any chance you are pregnant?" "When was your last menstrual period?"       N/a  Protocols used: Anxiety and Panic Attack-A-AH

## 2021-10-20 NOTE — Telephone Encounter (Signed)
Pt c/o moderate anxiety for past month. Sx tense, panicky,anxious,keyed up, and overwhelmed. Sx have not impaired her ability to work or function. Denies Suicidal ideation. Pt has been exercising more to help sx. Pt c/o of tense sensation top of neck similar to when she anxiety surrounding the death of her father. Denied chest pain or pressure. No therapist. Has a good support system and lives with her husband. Virtual appointment made for 10/27/21.

## 2021-10-27 ENCOUNTER — Telehealth: Payer: BC Managed Care – PPO | Admitting: Family Medicine

## 2021-11-04 ENCOUNTER — Other Ambulatory Visit: Payer: Self-pay | Admitting: Cardiovascular Disease

## 2021-11-28 ENCOUNTER — Encounter: Payer: Self-pay | Admitting: Cardiovascular Disease

## 2021-11-28 ENCOUNTER — Other Ambulatory Visit: Payer: Self-pay

## 2021-11-28 ENCOUNTER — Ambulatory Visit: Payer: BC Managed Care – PPO | Admitting: Cardiovascular Disease

## 2021-11-28 VITALS — BP 160/88 | HR 56 | Ht 62.0 in | Wt 185.0 lb

## 2021-11-28 DIAGNOSIS — R0789 Other chest pain: Secondary | ICD-10-CM | POA: Diagnosis not present

## 2021-11-28 DIAGNOSIS — I10 Essential (primary) hypertension: Secondary | ICD-10-CM | POA: Diagnosis not present

## 2021-11-28 DIAGNOSIS — E782 Mixed hyperlipidemia: Secondary | ICD-10-CM | POA: Diagnosis not present

## 2021-11-28 MED ORDER — HYDROCHLOROTHIAZIDE 25 MG PO TABS: 25.0000 mg | ORAL_TABLET | Freq: Every day | ORAL | 3 refills | Status: AC | PRN

## 2021-11-28 MED ORDER — HYDROCHLOROTHIAZIDE 25 MG PO TABS: 25.0000 mg | ORAL_TABLET | Freq: Every day | ORAL | 3 refills | Status: DC | PRN

## 2021-11-28 NOTE — Patient Instructions (Addendum)
Medication Instructions:  HCTZ 25 mg daily as needed  for swelling, fluid retention, or high pressure >140 Take OTC supplement potassium when taking HTCZ If you take frequently, call for labs work  If you need a refill on your cardiac medications before your next appointment, please call your pharmacy.   Lab work: No new labs needed  Testing/Procedures: No new testing needed  Follow-Up: At [facility], you and your health needs are our priority.  As part of our continuing mission to provide you with exceptional heart care, we have created designated Provider Care Teams.  These Care Teams include your primary Cardiologist (physician) and Advanced Practice Providers (APPs -  Physician Assistants and Nurse Practitioners) who all work together to provide you with the care you need, when you need it.  You will need a follow up appointment in 12 months  Providers on your designated Care Team:   Murray Hodgkins, NP Christell Faith, PA-C Cadence Kathlen Mody, [place]  COVID-19 Vaccine Information can be found at: [email or web address] For questions related to vaccine distribution or appointments, please email [email or web address] or call [phone].

## 2021-11-28 NOTE — Progress Notes (Signed)
Cardiology Office Note  Date:  11/28/2021   ID:  Carla [name], DOB 1961/09/15, MRN [id number]  PCP:  Olin Hauser, DO   Chief Complaint  Patient presents with   1 year follow up     "Doing well." Medications reviewed by the patient verbally.     HPI:  Carla [name] is a 61 -year-old  woman with a history of  bradycardia,  hyperlipidemia,  hypertension  2.7 cm mixed cystic solid right thyroid nodule. Carotid: Minor atherosclerotic plaque formation  2018 who presents for routine followup of her hyperlipidemia and bradycardia  Having her kitchen remodeled, has taken 3 months Has been eating out on a regular basis as she has no kitchen Trying to eat healthy, more vegetables Weight actually stable over the past year if not down half pound  No new active shortness of breath or chest pain issues  Continues to work full-time Works for a [employer] firm  In the past with regular exercise, active  Continues on Vytorin half pill  Calcium score 0 June 2016 she had CT coronary calcium  Losartan for blood pressure, does not check blood pressure at home Elevated today, rushing  EKG personally reviewed by myself on todays visit  shows normal sinus rhythm rate 56 bpm no significant ST or T-wave changes   Other past medical history Previously on Vytorin 5/20 milligrams daily, change to Lipitor for cost reasons Change to Crestor 10 mg daily Back to Vytorin   Other past medical history  Lost her father in September 2014.    04/26/17  chest pain/tightness  3 episodes Called ems EKG not acute BP was noted to be 170/90.  Stress test 04/2017: no ischemia  Carotid u/s Minor atherosclerotic plaque formation.   2.7 cm mixed cystic solid right thyroid nodule.  PMH:   has a past medical history of Anxiety, Depression, HLD (hyperlipidemia), HTN (hypertension), Thyroid disease, and Venous insufficiency.  PSH:    Past Surgical History:  Procedure Laterality Date   BIOPSY  THYROID     BUNIONECTOMY     hysterctomy      Current Outpatient Medications  Medication Sig Dispense Refill   calcium carbonate (OS-CAL) 600 MG TABS Take 600 mg by mouth daily.     estradiol (ESTRACE) 0.5 MG tablet Take 0.5 mg by mouth daily.     ezetimibe-simvastatin (VYTORIN) 10-40 MG tablet Take 1 tablet by mouth daily. 90 tablet 3   fluconazole (DIFLUCAN) 150 MG tablet Take one tablet by mouth on Day 1. Repeat dose 2nd tablet on Day 3. 2 tablet 1   Lactobacillus (PROBIOTIC ACIDOPHILUS PO) Take by mouth daily.     Lifitegrast (XIIDRA) 5 % SOLN Apply 1 drop to eye 2 (two) times daily.     losartan (COZAAR) 100 MG tablet Take 1 tablet (100 mg total) by mouth daily. 90 tablet 0   Multiple Vitamin (MULTIVITAMIN) tablet Take 1 tablet by mouth daily.     Omega-3 Fatty Acids (FISH OIL) 1200 MG CAPS Take 1 capsule by mouth daily.     Polyvinyl Alcohol-Povidone (TEARS PLUS OP) Apply 1 drop to eye 2 (two) times daily.     vitamin E 400 UNIT capsule Take 400 Units by mouth daily.     amoxicillin-clavulanate (AUGMENTIN) 875-125 MG tablet Take 1 tablet by mouth 2 (two) times daily. (Patient not taking: Reported on 11/28/2021) 20 tablet 0   aspirin 81 MG tablet Take 81 mg by mouth daily. (Patient not taking: Reported on 11/28/2021)  estradiol (ESTRACE) 1 MG tablet Take 1 mg by mouth daily.  (Patient not taking: Reported on 11/28/2021)     No current facility-administered medications for this visit.    Allergies:   Pseudoephedrine and Pseudoephedrine hcl   Social History:  The patient  reports that she has never smoked. She has never used smokeless tobacco. She reports current alcohol use of about 1.0 standard drink per week. She reports that she does not use drugs.   Family History:   family history includes Atrial fibrillation in her father; CAD in her father; Cancer in her mother; Heart disease in her father; Hyperlipidemia in her father.    Review of Systems: Review of Systems   Constitutional: Negative.   Respiratory: Negative.    Cardiovascular: Negative.   Gastrointestinal: Negative.   Musculoskeletal: Negative.   Neurological: Negative.   Psychiatric/Behavioral: Negative.    All other systems reviewed and are negative.   PHYSICAL EXAM: VS:  BP (!) 160/88 (BP Location: Left Arm, Patient Position: Sitting, Cuff Size: Normal)    Pulse (!) 56    Ht 5\' 2"  (1.575 m)    Wt 185 lb (83.9 kg)    LMP 04/29/2002 (Approximate) Comment: Hysterectomy 15 yrs ago per pt   SpO2 98%    BMI 33.84 kg/m  , BMI Body mass index is 33.84 kg/m. Constitutional:  oriented to person, place, and time. No distress.  HENT:  Head: Grossly normal Eyes:  no discharge. No scleral icterus.  Neck: No JVD, no carotid bruits  Cardiovascular: Regular rate and rhythm, no murmurs appreciated Pulmonary/Chest: Clear to auscultation bilaterally, no wheezes or rails Abdominal: Soft.  no distension.  no tenderness.  Musculoskeletal: Normal range of motion Neurological:  normal muscle tone. Coordination normal. No atrophy Skin: Skin warm and dry Psychiatric: normal affect, pleasant  Recent Labs: No results found for requested labs within last 8760 hours.    Lipid Panel Lab Results  Component Value Date   CHOL 194 04/29/2017   HDL 48 04/29/2017   LDLCALC 108 (H) 04/29/2017   TRIG 190 (H) 04/29/2017      Wt Readings from Last 3 Encounters:  11/28/21 185 lb (83.9 kg)  03/25/21 186 lb (84.4 kg)  11/26/20 186 lb (84.4 kg)     ASSESSMENT AND PLAN:  HYPERTENSION, BENIGN  Recommend she continue on losartan 100 daily We will add HCTZ 25 daily as needed for high blood pressure, ankle swelling, finger swelling.  Would take with low-dose potassium either over-the-counter or call for prescription If taking on a regular basis will need BMP, this was discussed Recommend she monitor pressures at home  Other hyperlipidemia  Takes half dose Vytorin, we have requested lab work from endocrinology  , Dr. Forde Dandy in [place] Weight stable  Atypical chest pain Previous CT coronary calcium score 0 Prior stress test 2018 no ischemia No anginal symptoms at this time  Bradycardia No recent symptoms, Avoid beta-blockers    Total encounter time more than 25 minutes  Greater than 50% was spent in counseling and coordination of care with the patient    No orders of the defined types were placed in this encounter.    Signed, Esmond Plants, M.D., Ph.D. 11/28/2021  [place], [place]

## 2021-12-15 DIAGNOSIS — R319 Hematuria, unspecified: Secondary | ICD-10-CM | POA: Diagnosis not present

## 2021-12-15 DIAGNOSIS — Z01419 Encounter for gynecological examination (general) (routine) without abnormal findings: Secondary | ICD-10-CM | POA: Diagnosis not present

## 2021-12-15 DIAGNOSIS — Z1272 Encounter for screening for malignant neoplasm of vagina: Secondary | ICD-10-CM | POA: Diagnosis not present

## 2021-12-15 DIAGNOSIS — Z6833 Body mass index (BMI) 33.0-33.9, adult: Secondary | ICD-10-CM | POA: Diagnosis not present

## 2021-12-15 DIAGNOSIS — R350 Frequency of micturition: Secondary | ICD-10-CM | POA: Diagnosis not present

## 2021-12-29 DIAGNOSIS — M7531 Calcific tendinitis of right shoulder: Secondary | ICD-10-CM | POA: Diagnosis not present

## 2021-12-31 ENCOUNTER — Other Ambulatory Visit: Payer: Self-pay | Admitting: Obstetrics and Gynecology

## 2021-12-31 DIAGNOSIS — N644 Mastodynia: Secondary | ICD-10-CM

## 2022-01-06 ENCOUNTER — Telehealth: Payer: BC Managed Care – PPO | Admitting: Family Medicine

## 2022-01-06 DIAGNOSIS — U071 COVID-19: Secondary | ICD-10-CM

## 2022-01-06 DIAGNOSIS — R051 Acute cough: Secondary | ICD-10-CM | POA: Diagnosis not present

## 2022-01-06 MED ORDER — MOLNUPIRAVIR EUA 200MG CAPSULE: 4.0000 | ORAL_CAPSULE | Freq: Two times a day (BID) | ORAL | 0 refills | Status: AC

## 2022-01-06 MED ORDER — FLUTICASONE PROPIONATE 50 MCG/ACT NA SUSP: 2.0000 | Freq: Every day | NASAL | 0 refills | Status: DC

## 2022-01-06 MED ORDER — BENZONATATE 100 MG PO CAPS: 100.0000 mg | ORAL_CAPSULE | Freq: Two times a day (BID) | ORAL | 0 refills | Status: DC | PRN

## 2022-01-06 NOTE — Progress Notes (Addendum)
Virtual Visit Consent   Carla [name], you are scheduled for a virtual visit with a [facility] provider today.     Just as with appointments in the office, your consent must be obtained to participate.  Your consent will be active for this visit and any virtual visit you may have with one of our providers in the next 365 days.     If you have a MyChart account, a copy of this consent can be sent to you electronically.  All virtual visits are billed to your insurance company just like a traditional visit in the office.    As this is a virtual visit, video technology does not allow for your provider to perform a traditional examination.  This may limit your provider's ability to fully assess your condition.  If your provider identifies any concerns that need to be evaluated in person or the need to arrange testing (such as labs, EKG, etc.), we will make arrangements to do so.     Although advances in technology are sophisticated, we cannot ensure that it will always work on either your end or our end.  If the connection with a video visit is poor, the visit may have to be switched to a telephone visit.  With either a video or telephone visit, we are not always able to ensure that we have a secure connection.     I need to obtain your verbal consent now.   Are you willing to proceed with your visit today?    Carla [name] has provided verbal consent on 01/06/2022 for a virtual visit (video or telephone).   Perlie Mayo, NP   Date: 01/06/2022 3:29 PM   Virtual Visit via Video Note   I, Perlie Mayo, connected with  Carla [name]  ([id number], 04/26/1961) on 01/06/22 at  3:30 PM EST by a video-enabled telemedicine application and verified that I am speaking with the correct person using two identifiers.  Location: Patient: Virtual Visit Location Patient: Home Provider: Virtual Visit Location Provider: Home Office   I discussed the limitations of evaluation and management by  telemedicine and the availability of in person appointments. The patient expressed understanding and agreed to proceed.    History of Present Illness: Carla [name] is a 61 y.o. who identifies as a female who was assigned female at birth, and is being seen today for COVID + today. Saturday cough and just not feeling well. Sunday developed some congestion, and cough, ear pain. Unsure if feverish- coming off estrogen so having hot flashes. Denis chest pain, shortness of breath, fever, sore throat.   COVID vaccinated and boosted  Problems:  Patient Active Problem List   Diagnosis Date Noted   Lipoma of left upper extremity 02/01/2019   Chest pain with moderate risk for cardiac etiology 07/11/2018   Venous insufficiency    Shortness of breath 04/25/2015   Chest tightness 05/22/2014   Hyperlipidemia 02/12/2010   Essential hypertension 02/12/2010    Allergies:  Allergies  Allergen Reactions   Pseudoephedrine Other (See Comments) and Rash    Pt said it made her heart race  Pt said it made her heart race    Pseudoephedrine Hcl Other (See Comments)    "Heart raced"   Medications:  Current Outpatient Medications:    amoxicillin-clavulanate (AUGMENTIN) 875-125 MG tablet, Take 1 tablet by mouth 2 (two) times daily. (Patient not taking: Reported on 11/28/2021), Disp: 20 tablet, Rfl: 0   aspirin 81 MG tablet,  Take 81 mg by mouth daily. (Patient not taking: Reported on 11/28/2021), Disp: , Rfl:    calcium carbonate (OS-CAL) 600 MG TABS, Take 600 mg by mouth daily., Disp: , Rfl:    estradiol (ESTRACE) 0.5 MG tablet, Take 0.5 mg by mouth daily., Disp: , Rfl:    estradiol (ESTRACE) 1 MG tablet, Take 1 mg by mouth daily.  (Patient not taking: Reported on 11/28/2021), Disp: , Rfl:    ezetimibe-simvastatin (VYTORIN) 10-40 MG tablet, Take 1 tablet by mouth daily., Disp: 90 tablet, Rfl: 3   fluconazole (DIFLUCAN) 150 MG tablet, Take one tablet by mouth on Day 1. Repeat dose 2nd tablet on Day 3., Disp: 2  tablet, Rfl: 1   hydrochlorothiazide (HYDRODIURIL) 25 MG tablet, Take 1 tablet (25 mg total) by mouth daily as needed. For swelling, fluid retention, high pressure >140. Take OTC potassium when taking, Disp: 90 tablet, Rfl: 3   Lactobacillus (PROBIOTIC ACIDOPHILUS PO), Take by mouth daily., Disp: , Rfl:    Lifitegrast (XIIDRA) 5 % SOLN, Apply 1 drop to eye 2 (two) times daily., Disp: , Rfl:    losartan (COZAAR) 100 MG tablet, Take 1 tablet (100 mg total) by mouth daily., Disp: 90 tablet, Rfl: 0   Multiple Vitamin (MULTIVITAMIN) tablet, Take 1 tablet by mouth daily., Disp: , Rfl:    Omega-3 Fatty Acids (FISH OIL) 1200 MG CAPS, Take 1 capsule by mouth daily., Disp: , Rfl:    Polyvinyl Alcohol-Povidone (TEARS PLUS OP), Apply 1 drop to eye 2 (two) times daily., Disp: , Rfl:    vitamin E 400 UNIT capsule, Take 400 Units by mouth daily., Disp: , Rfl:   Observations/Objective: Patient is well-developed, well-nourished in no acute distress.  Resting comfortably  at home.  Head is normocephalic, atraumatic.  No labored breathing.  Speech is clear and coherent with logical content.  Patient is alert and oriented at baseline.    Assessment and Plan:  1. COVID-19 COVID + Desires antiviral has not had labs since Sept Will do Molnupiravir -education provided  Hydration, rest, vitamin C and OTC info on AVS    - benzonatate (TESSALON) 100 MG capsule; Take 1 capsule (100 mg total) by mouth 2 (two) times daily as needed for cough.  Dispense: 20 capsule; Refill: 0 - fluticasone (FLONASE) 50 MCG/ACT nasal spray; Place 2 sprays into both nostrils daily.  Dispense: 16 g; Refill: 0  - molnupiravir EUA (LAGEVRIO) 200 mg CAPS capsule; Take 4 capsules (800 mg total) by mouth 2 (two) times daily for 5 days.  Dispense: 40 capsule; Refill: 0    2. Acute cough Cough is worse symptom right now Perles ordered  OTC discussed on AVS   - benzonatate (TESSALON) 100 MG capsule; Take 1 capsule (100 mg total) by  mouth 2 (two) times daily as needed for cough.  Dispense: 20 capsule; Refill: 0  Reviewed side effects, risks and benefits of medication.    Patient acknowledged agreement and understanding of the plan.   I discussed the assessment and treatment plan with the patient. The patient was provided an opportunity to ask questions and all were answered. The patient agreed with the plan and demonstrated an understanding of the instructions.   The patient was advised to call back or seek an in-person evaluation if the symptoms worsen or if the condition fails to improve as anticipated.   The above assessment and management plan was discussed with the patient. The patient verbalized understanding of and has agreed to the management  plan. Patient is aware to call the clinic if symptoms persist or worsen. Patient is aware when to return to the clinic for a follow-up visit. Patient educated on when it is appropriate to go to the emergency department.    Follow Up Instructions: I discussed the assessment and treatment plan with the patient. The patient was provided an opportunity to ask questions and all were answered. The patient agreed with the plan and demonstrated an understanding of the instructions.  A copy of instructions were sent to the patient via MyChart unless otherwise noted below.     The patient was advised to call back or seek an in-person evaluation if the symptoms worsen or if the condition fails to improve as anticipated.  Time:  I spent 10 minutes with the patient via telehealth technology discussing the above problems/concerns.    Perlie Mayo, NP

## 2022-01-06 NOTE — Patient Instructions (Signed)
Please keep well-hydrated and get plenty of rest. Start a saline nasal rinse to flush out your nasal passages. You can use plain Mucinex to help thin congestion. If you have a humidifier, running in the bedroom at night. I want you to start OTC vitamin D3 1000 units daily, vitamin C 1000 mg daily, and a zinc supplement. Please take prescribed medications as directed.  You have been enrolled in a MyChart symptom monitoring program. Please answer these questions daily so we can keep track of how you are doing.  You were to quarantine for 5 days from onset of your symptoms.  After day 5, if you have had no fever and you are feeling better, you can end quarantine but need to mask for an additional 5 days. After day 5 if you have a fever or are having significant symptoms, please quarantine for full 10 days.  If you note any worsening of symptoms, any significant shortness of breath or any chest pain, please seek ER evaluation ASAP.  Please do not delay care!  COVID-19: What to Do if You Are Sick If you test positive and are an older adult or someone who is at high risk of getting very sick from COVID-19, treatment may be available. Contact a healthcare provider right away after a positive test to determine if you are eligible, even if your symptoms are mild right now. You can also visit a Test to Treat location and, if eligible, receive a prescription from a provider. Don't delay: Treatment must be started within the first few days to be effective. If you have a fever, cough, or other symptoms, you might have COVID-19. Most people have mild illness and are able to recover at home. If you are sick: Keep track of your symptoms. If you have an emergency warning sign (including trouble breathing), call 911. Steps to help prevent the spread of COVID-19 if you are sick If you are sick with COVID-19 or think you might have COVID-19, follow the steps below to care for yourself and to help protect other  people in your home and community. Stay home except to get medical care Stay home. Most people with COVID-19 have mild illness and can recover at home without medical care. Do not leave your home, except to get medical care. Do not visit public areas and do not go to places where you are unable to wear a mask. Take care of yourself. Get rest and stay hydrated. Take over-the-counter medicines, such as acetaminophen, to help you feel better. Stay in touch with your doctor. Call before you get medical care. Be sure to get care if you have trouble breathing, or have any other emergency warning signs, or if you think it is an emergency. Avoid public transportation, ride-sharing, or taxis if possible. Get tested If you have symptoms of COVID-19, get tested. While waiting for test results, stay away from others, including staying apart from those living in your household. Get tested as soon as possible after your symptoms start. Treatments may be available for people with COVID-19 who are at risk for becoming very sick. Don't delay: Treatment must be started early to be effective--some treatments must begin within 5 days of your first symptoms. Contact your healthcare provider right away if your test result is positive to determine if you are eligible. Self-tests are one of several options for testing for the virus that causes COVID-19 and may be more convenient than laboratory-based tests and point-of-care tests. Ask your healthcare provider or   your local health department if you need help interpreting your test results. You can visit your state, tribal, local, and territorial health department's website to look for the latest local information on testing sites. Separate yourself from other people As much as possible, stay in a specific room and away from other people and pets in your home. If possible, you should use a separate bathroom. If you need to be around other people or animals in or outside of the  home, wear a well-fitting mask. Tell your close contacts that they may have been exposed to COVID-19. An infected person can spread COVID-19 starting 48 hours (or 2 days) before the person has any symptoms or tests positive. By letting your close contacts know they may have been exposed to COVID-19, you are helping to protect everyone. See COVID-19 and Animals if you have questions about pets. If you are diagnosed with COVID-19, someone from the health department may call you. Answer the call to slow the spread. Monitor your symptoms Symptoms of COVID-19 include fever, cough, or other symptoms. Follow care instructions from your healthcare provider and local health department. Your local health authorities may give instructions on checking your symptoms and reporting information. When to seek emergency medical attention Look for emergency warning signs* for COVID-19. If someone is showing any of these signs, seek emergency medical care immediately: Trouble breathing Persistent pain or pressure in the chest New confusion Inability to wake or stay awake Pale, gray, or blue-colored skin, lips, or nail beds, depending on skin tone *This list is not all possible symptoms. Please call your medical provider for any other symptoms that are severe or concerning to you. Call 911 or call ahead to your local emergency facility: Notify the operator that you are seeking care for someone who has or may have COVID-19. Call ahead before visiting your doctor Call ahead. Many medical visits for routine care are being postponed or done by phone or telemedicine. If you have a medical appointment that cannot be postponed, call your doctor's office, and tell them you have or may have COVID-19. This will help the office protect themselves and other patients. If you are sick, wear a well-fitting mask You should wear a mask if you must be around other people or animals, including pets (even at home). Wear a mask with the  best fit, protection, and comfort for you. You don't need to wear the mask if you are alone. If you can't put on a mask (because of trouble breathing, for example), cover your coughs and sneezes in some other way. Try to stay at least 6 feet away from other people. This will help protect the people around you. Masks should not be placed on young children under age 2 years, anyone who has trouble breathing, or anyone who is not able to remove the mask without help. Cover your coughs and sneezes Cover your mouth and nose with a tissue when you cough or sneeze. Throw away used tissues in a lined trash can. Immediately wash your hands with soap and water for at least 20 seconds. If soap and water are not available, clean your hands with an alcohol-based hand sanitizer that contains at least 60% alcohol. Clean your hands often Wash your hands often with soap and water for at least 20 seconds. This is especially important after blowing your nose, coughing, or sneezing; going to the bathroom; and before eating or preparing food. Use hand sanitizer if soap and water are not available. Use an   alcohol-based hand sanitizer with at least 60% alcohol, covering all surfaces of your hands and rubbing them together until they feel dry. Soap and water are the best option, especially if hands are visibly dirty. Avoid touching your eyes, nose, and mouth with unwashed hands. Handwashing Tips Avoid sharing personal household items Do not share dishes, drinking glasses, cups, eating utensils, towels, or bedding with other people in your home. Wash these items thoroughly after using them with soap and water or put in the dishwasher. Clean surfaces in your home regularly Clean and disinfect high-touch surfaces (for example, doorknobs, tables, handles, light switches, and countertops) in your "sick room" and bathroom. In shared spaces, you should clean and disinfect surfaces and items after each use by the person who is  ill. If you are sick and cannot clean, a caregiver or other person should only clean and disinfect the area around you (such as your bedroom and bathroom) on an as needed basis. Your caregiver/other person should wait as long as possible (at least several hours) and wear a mask before entering, cleaning, and disinfecting shared spaces that you use. Clean and disinfect areas that may have blood, stool, or body fluids on them. Use household cleaners and disinfectants. Clean visible dirty surfaces with household cleaners containing soap or detergent. Then, use a household disinfectant. Use a product from EPA's List N: Disinfectants for Coronavirus (COVID-19). Be sure to follow the instructions on the label to ensure safe and effective use of the product. Many products recommend keeping the surface wet with a disinfectant for a certain period of time (look at "contact time" on the product label). You may also need to wear personal protective equipment, such as gloves, depending on the directions on the product label. Immediately after disinfecting, wash your hands with soap and water for 20 seconds. For completed guidance on cleaning and disinfecting your home, visit Complete Disinfection Guidance. Take steps to improve ventilation at home Improve ventilation (air flow) at home to help prevent from spreading COVID-19 to other people in your household. Clear out COVID-19 virus particles in the air by opening windows, using air filters, and turning on fans in your home. Use this interactive tool to learn how to improve air flow in your home. When you can be around others after being sick with COVID-19 Deciding when you can be around others is different for different situations. Find out when you can safely end home isolation. For any additional questions about your care, contact your healthcare provider or state or local health department. 02/11/2021 Content source: National Center for Immunization and  Respiratory Diseases (NCIRD), Division of Viral Diseases This information is not intended to replace advice given to you by your health care provider. Make sure you discuss any questions you have with your health care provider. Document Revised: 03/27/2021 Document Reviewed: 03/27/2021 Elsevier Patient Education  2022 Elsevier Inc.    

## 2022-01-06 NOTE — Addendum Note (Signed)
Addended by: Perlie Mayo on: 01/06/2022 03:58 PM   Modules accepted: Orders

## 2022-01-15 ENCOUNTER — Ambulatory Visit: Payer: Self-pay

## 2022-01-15 DIAGNOSIS — M25611 Stiffness of right shoulder, not elsewhere classified: Secondary | ICD-10-CM | POA: Diagnosis not present

## 2022-01-15 DIAGNOSIS — M25511 Pain in right shoulder: Secondary | ICD-10-CM | POA: Diagnosis not present

## 2022-01-15 NOTE — Telephone Encounter (Signed)
This appears to be a duplicate request. I received same request on her husband's chart  Nobie Putnam, [facility] 01/15/2022, 2:40 PM

## 2022-01-15 NOTE — Telephone Encounter (Signed)
Patient states he received the antibody prescription for COVID and he was feeling better and then last night started to feel congested.     Chief Complaint: Had COVID 19 last week. Took anti-viral and last night had head congestion return. Symptoms: Head congestion. No other symptoms.Taking Coricidin. Frequency: Last week Pertinent Negatives: Patient denies fever Disposition: [] ED /[] Urgent Care (no appt availability in office) / [] Appointment(In office/virtual)/ []  [facility]/ [] Home Care/ [] Refused Recommended Disposition /[] [facility]/ []  Follow-up with PCP Additional Notes: Wants to know from PCP what else she can take to feel better. Please advise.  Answer Assessment - Initial Assessment Questions 1. COVID-19 ONSET: "When did the symptoms of COVID-19 first start?"     Last week 2. DIAGNOSIS CONFIRMATION: "How were you diagnosed?" (e.g., COVID-19 oral or nasal viral test; COVID-19 antibody test; doctor visit)     Home test 3. MAIN SYMPTOM:  "What is your main concern or symptom right now?" (e.g., breathing difficulty, cough, fatigue. loss of smell)     Head congestion 4. SYMPTOM ONSET: "When did the  symptom   start?"     Last week 5. BETTER-SAME-WORSE: "Are you getting better, staying the same, or getting worse over the last 1 to 2 weeks?"     Same 6. RECENT MEDICAL VISIT: "Have you been seen by a healthcare provider (doctor, NP, PA) for these persisting COVID-19 symptoms?" If Yes, ask: "When were you seen?" (e.g., date)     Tele health 7. COUGH: "Do you have a cough?" If Yes, ask: "How bad is the cough?"       No 8. FEVER: "Do you have a fever?" If Yes, ask: "What is your temperature, how was it measured, and when did it start?"     No 9. BREATHING DIFFICULTY: "Are you having any trouble breathing?" If Yes, ask: "How bad is your breathing?" (e.g., mild, moderate, severe)    - MILD: No SOB at rest, mild SOB with walking, speaks normally in sentences, can lie  down, no retractions, pulse < 100.    - MODERATE: SOB at rest, SOB with minimal exertion and prefers to sit, cannot lie down flat, speaks in phrases, mild retractions, audible wheezing, pulse 100-120.    - SEVERE: Very SOB at rest, speaks in single words, struggling to breathe, sitting hunched forward, retractions, pulse > 120       No 10. HIGH RISK DISEASE: "Do you have any chronic medical problems?" (e.g., asthma, heart or lung disease, weak immune system, obesity, etc.)       HTN 11. VACCINE: "Have you gotten the COVID-19 vaccine?" If Yes, ask: "Which one, how many shots, when did you get it?"       N/A 12. BOOSTER: "Have you received your COVID-19 booster?" If Yes, ask: "Which one and when did you get it?"       N/a 13. PREGNANCY: "Is there any chance you are pregnant?" "When was your last menstrual period?"       No 14. OTHER SYMPTOMS: "Do you have any other symptoms?"  (e.g., fatigue, headache, muscle pain, weakness)       No 15. O2 SATURATION MONITOR:  "Do you use an oxygen saturation monitor (pulse oximeter) at home?" If Yes, ask "What is your reading (oxygen level) today?" "What is your usual oxygen saturation reading?" (e.g., 95%)       No  Protocols used: Coronavirus (COVID-19) Persisting Symptoms Follow-up Call-[facility]

## 2022-01-16 ENCOUNTER — Other Ambulatory Visit: Payer: Self-pay | Admitting: Family Medicine

## 2022-01-16 DIAGNOSIS — U071 COVID-19: Secondary | ICD-10-CM

## 2022-01-16 MED ORDER — PREDNISONE 10 MG PO TABS: ORAL_TABLET | ORAL | 0 refills | Status: DC

## 2022-01-19 ENCOUNTER — Ambulatory Visit
Admission: RE | Admit: 2022-01-19 | Discharge: 2022-01-19 | Disposition: A | Discharge status: Home / Self Care | Payer: BC Managed Care – PPO | Source: Ambulatory Visit | Attending: Obstetrics and Gynecology | Admitting: Obstetrics and Gynecology

## 2022-01-19 ENCOUNTER — Ambulatory Visit: Payer: BC Managed Care – PPO

## 2022-01-19 DIAGNOSIS — R922 Inconclusive mammogram: Secondary | ICD-10-CM | POA: Diagnosis not present

## 2022-01-19 DIAGNOSIS — N644 Mastodynia: Secondary | ICD-10-CM

## 2022-01-29 ENCOUNTER — Other Ambulatory Visit: Payer: BC Managed Care – PPO

## 2022-02-05 ENCOUNTER — Other Ambulatory Visit: Payer: Self-pay | Admitting: Cardiovascular Disease

## 2022-04-13 DIAGNOSIS — L821 Other seborrheic keratosis: Secondary | ICD-10-CM | POA: Diagnosis not present

## 2022-04-13 DIAGNOSIS — D2339 Other benign neoplasm of skin of other parts of face: Secondary | ICD-10-CM | POA: Diagnosis not present

## 2022-06-07 ENCOUNTER — Other Ambulatory Visit: Payer: Self-pay | Admitting: Cardiovascular Disease

## 2022-06-12 ENCOUNTER — Telehealth: Payer: Self-pay | Admitting: Cardiovascular Disease

## 2022-06-12 MED ORDER — EZETIMIBE-SIMVASTATIN 10-40 MG PO TABS: 1.0000 | ORAL_TABLET | Freq: Every day | ORAL | 2 refills | Status: DC

## 2022-06-12 NOTE — Telephone Encounter (Signed)
*  STAT* If patient is at the pharmacy, call can be transferred to refill team.   1. Which medications need to be refilled? (please list name of each medication and dose if known)   ezetimibe-simvastatin (VYTORIN) 10-40 MG tablet    2. Which pharmacy/location (including street and city if local pharmacy) is medication to be sent to?   3. Do they need a 30 day or 90 day supply? 90  Pt states she is completely out of medication

## 2022-06-12 NOTE — Telephone Encounter (Signed)
Requested Prescriptions   Signed Prescriptions Disp Refills   ezetimibe-simvastatin (VYTORIN) 10-40 MG tablet 90 tablet 2    Sig: Take 1 tablet by mouth daily.    Authorizing Provider: Minna Merritts    Ordering User: Britt Bottom

## 2022-08-11 DIAGNOSIS — E785 Hyperlipidemia, unspecified: Secondary | ICD-10-CM | POA: Diagnosis not present

## 2022-08-11 DIAGNOSIS — I10 Essential (primary) hypertension: Secondary | ICD-10-CM | POA: Diagnosis not present

## 2022-08-11 DIAGNOSIS — E041 Nontoxic single thyroid nodule: Secondary | ICD-10-CM | POA: Diagnosis not present

## 2022-08-11 DIAGNOSIS — D352 Benign neoplasm of pituitary gland: Secondary | ICD-10-CM | POA: Diagnosis not present

## 2022-11-25 DIAGNOSIS — E785 Hyperlipidemia, unspecified: Secondary | ICD-10-CM | POA: Diagnosis not present

## 2023-01-06 DIAGNOSIS — T1512XA Foreign body in conjunctival sac, left eye, initial encounter: Secondary | ICD-10-CM | POA: Diagnosis not present

## 2023-01-19 DIAGNOSIS — R0981 Nasal congestion: Secondary | ICD-10-CM | POA: Diagnosis not present

## 2023-01-19 DIAGNOSIS — J301 Allergic rhinitis due to pollen: Secondary | ICD-10-CM | POA: Diagnosis not present

## 2023-01-19 DIAGNOSIS — J309 Allergic rhinitis, unspecified: Secondary | ICD-10-CM | POA: Diagnosis not present

## 2023-01-21 DIAGNOSIS — Z01419 Encounter for gynecological examination (general) (routine) without abnormal findings: Secondary | ICD-10-CM | POA: Diagnosis not present

## 2023-01-21 DIAGNOSIS — Z6832 Body mass index (BMI) 32.0-32.9, adult: Secondary | ICD-10-CM | POA: Diagnosis not present

## 2023-01-21 DIAGNOSIS — Z1231 Encounter for screening mammogram for malignant neoplasm of breast: Secondary | ICD-10-CM | POA: Diagnosis not present

## 2023-01-21 IMAGING — MG DIGITAL DIAGNOSTIC BILAT W/ TOMO W/ CAD
6 of 12 series · 6 of 36 positions shown · non-contrast
Comparison: Previous exam(s).

CLINICAL DATA: Patient presents for left breast pain.

EXAM:
DIGITAL DIAGNOSTIC BILATERAL MAMMOGRAM WITH TOMOSYNTHESIS AND CAD
TECHNIQUE: Bilateral digital diagnostic mammography and breast tomosynthesis
was performed. The images were evaluated with computer-aided
detection.

[R MLO synth-2D (1 of 2)]
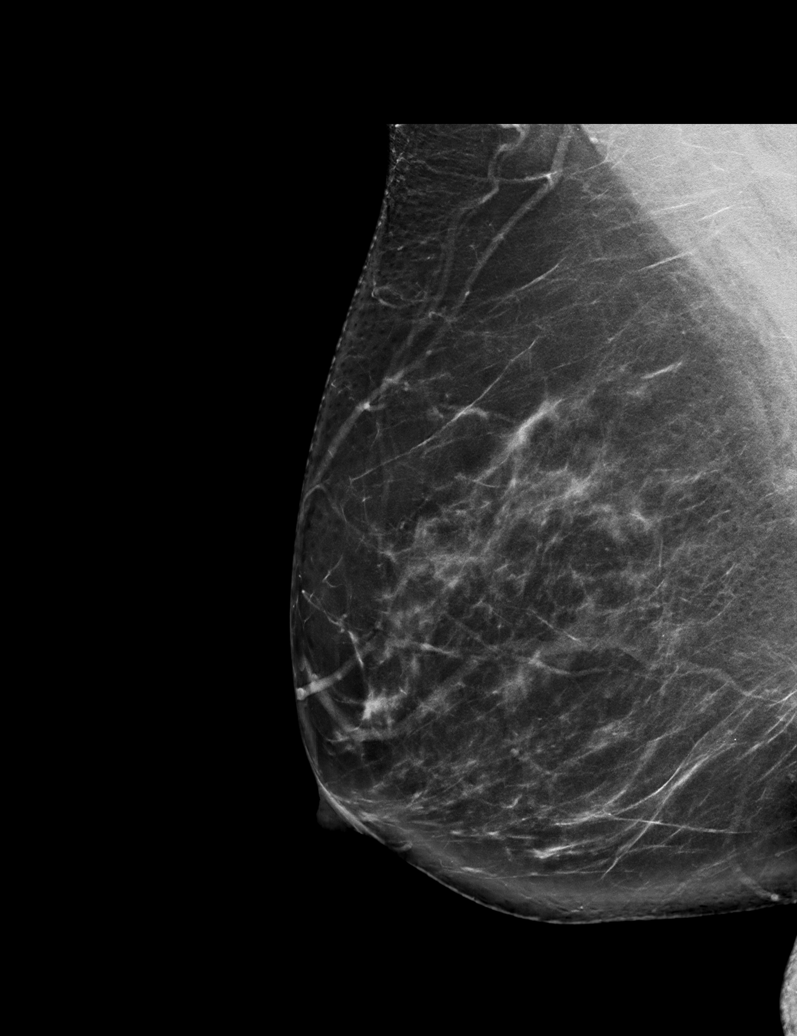

[L MLO synth-2D]
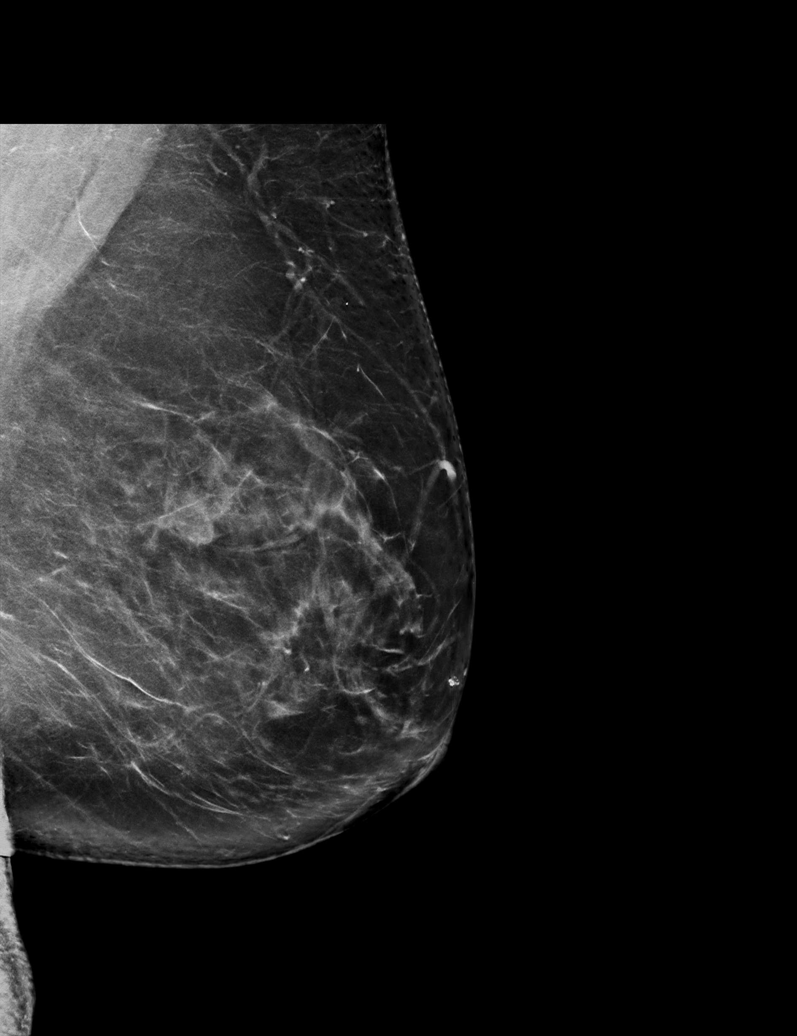

[L CC synth-2D]
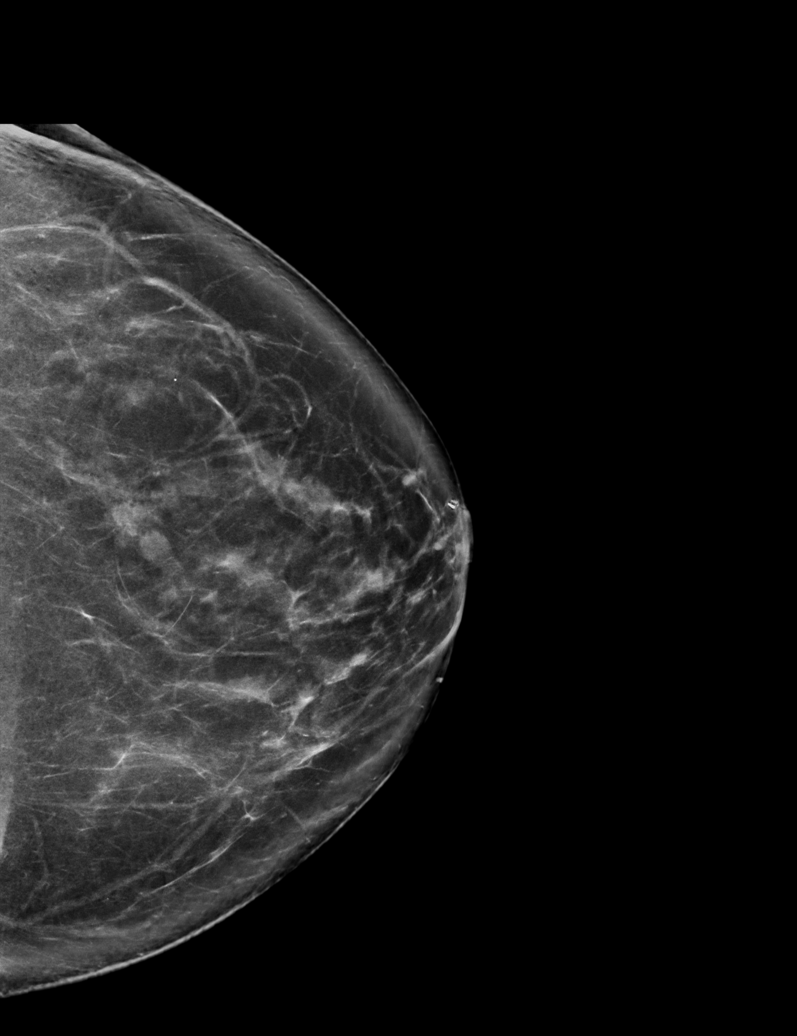

[R ML synth-2D]
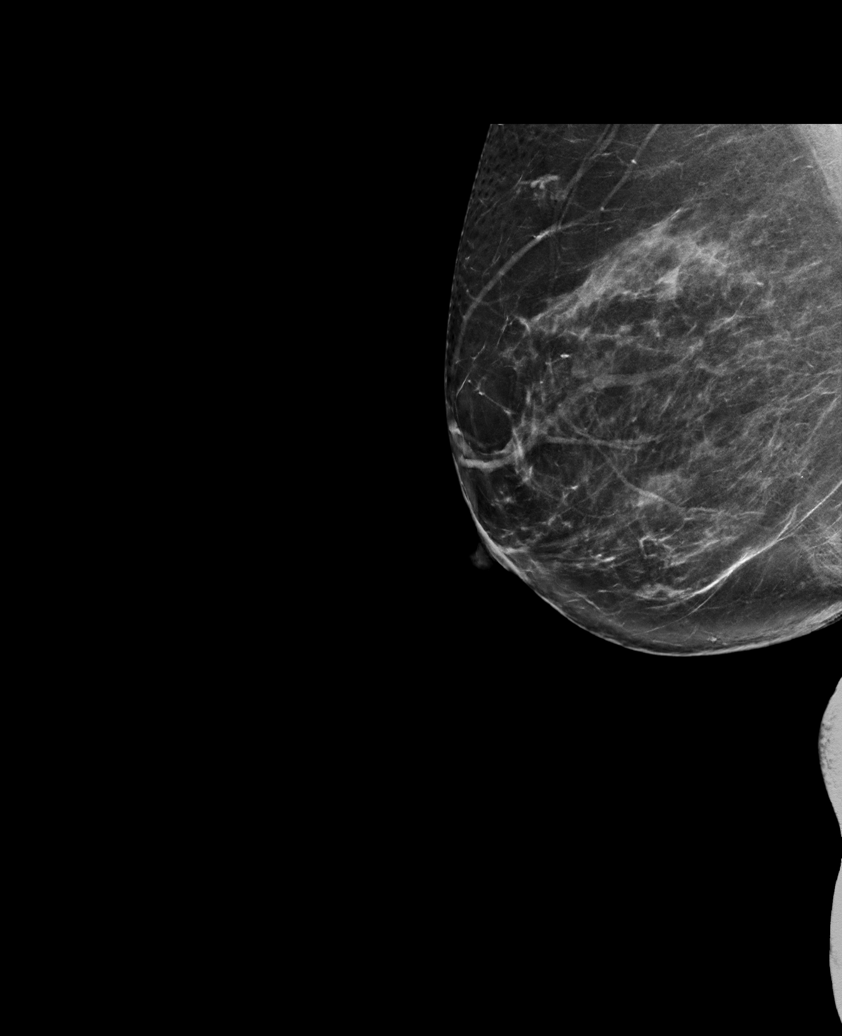

[R MLO synth-2D (2 of 2)]
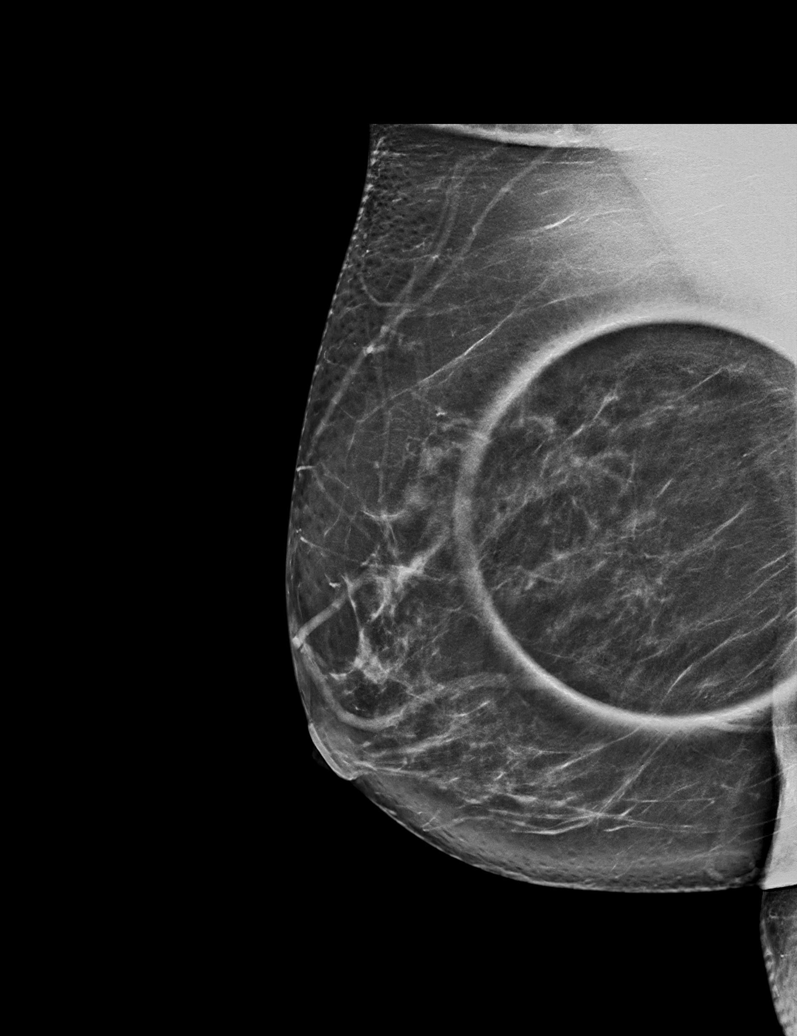

[R CC synth-2D]
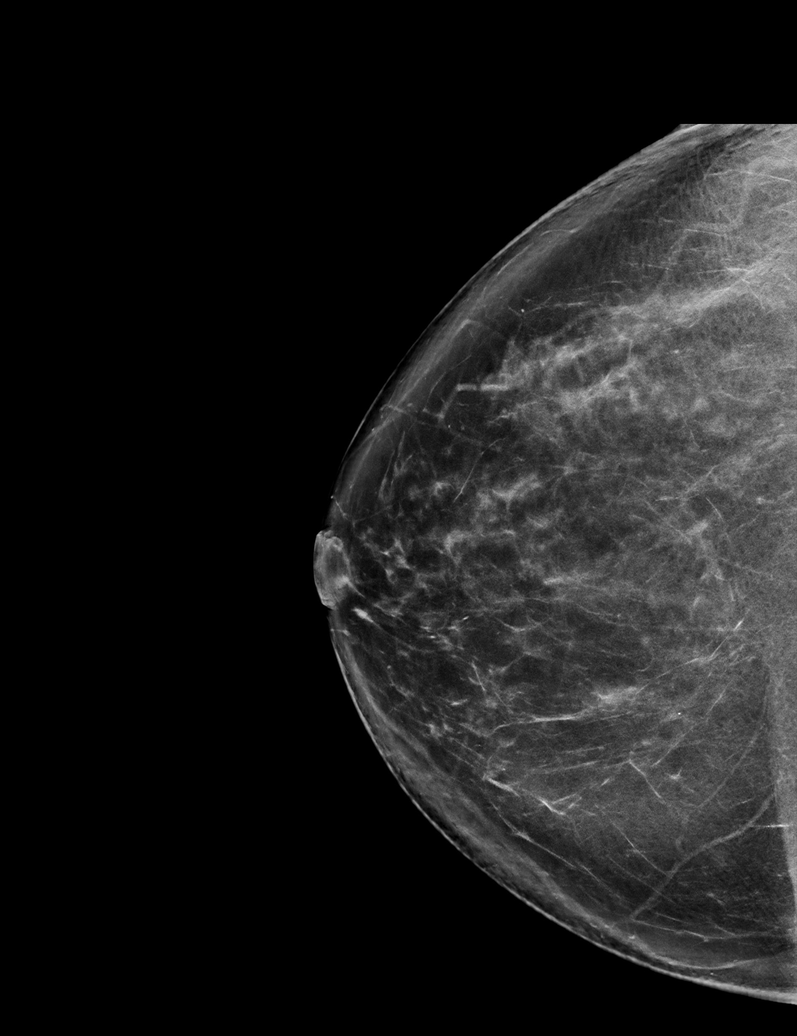

[6 of 36 positions shown; findings below may reference images not displayed]

ACR Breast Density Category c: The breast tissue is heterogeneously
dense, which may obscure small masses.
FINDINGS: No concerning masses, calcifications or distortion identified within
either breast.
IMPRESSION: No mammographic evidence for malignancy.

RECOMMENDATION:
Screening mammogram in one year.(Code:[id number]-[id number])

Continued clinical evaluation for left breast pain.

I have discussed the findings and recommendations with the patient.
If applicable, a reminder letter will be sent to the patient
regarding the next appointment.

BI-RADS CATEGORY  1: Negative.

## 2023-01-25 ENCOUNTER — Encounter: Payer: Self-pay | Admitting: Family Medicine

## 2023-01-25 ENCOUNTER — Ambulatory Visit (INDEPENDENT_AMBULATORY_CARE_PROVIDER_SITE_OTHER): Payer: BC Managed Care – PPO | Admitting: Family Medicine

## 2023-01-25 VITALS — BP 128/86 | HR 66 | Temp 98.6°F | Resp 18 | Ht 62.0 in | Wt 186.0 lb

## 2023-01-25 DIAGNOSIS — E669 Obesity, unspecified: Secondary | ICD-10-CM | POA: Diagnosis not present

## 2023-01-25 DIAGNOSIS — Z6834 Body mass index (BMI) 34.0-34.9, adult: Secondary | ICD-10-CM

## 2023-01-25 DIAGNOSIS — R7309 Other abnormal glucose: Secondary | ICD-10-CM

## 2023-01-25 LAB — POCT GLYCOSYLATED HEMOGLOBIN (HGB A1C): Hemoglobin A1C: 5.7 % — AB (ref 4.0–5.6)

## 2023-01-25 NOTE — Progress Notes (Unsigned)
Subjective:    Patient ID: Carla [name], female    DOB: Jan 05, 1961, 62 y.o.   MRN: [id number]  Carla [name] is a 62 y.o. female presenting on 01/25/2023 for Weight Management Screening (The pt comes in today with concerns with weight management )   HPI  Followed by [facility] Dr Beverly Gust Allergy testing  Obesity BMI >34 She has worked on lifestyle intervention with reduced portion intake, diet modifications, reduced bread and carbs starches sugar, exercise. She would have some success temporarily with losing weight but then gaining weight again Interested in medication today  Followed by  Dr Forde Dandy [facility] Prior thyroid panel per Endocrine Due for A1c      01/25/2023    4:01 PM  Depression screen PHQ 2/9  Decreased Interest 0  Down, Depressed, Hopeless 0  PHQ - 2 Score 0  Altered sleeping 0  Tired, decreased energy 0  Change in appetite 0  Feeling bad or failure about yourself  0  Trouble concentrating 0  Moving slowly or fidgety/restless 0  Suicidal thoughts 0  PHQ-9 Score 0  Difficult doing work/chores Not difficult at all    Social History   Tobacco Use   Smoking status: Never   Smokeless tobacco: Never  Vaping Use   Vaping Use: Never used  Substance Use Topics   Alcohol use: Yes    Alcohol/week: 1.0 standard drink of alcohol    Types: 1 Standard drinks or equivalent per week    Comment: rarely   Drug use: No    Review of Systems Per HPI unless specifically indicated above     Objective:    BP 128/86 (BP Location: Right Arm, Patient Position: Sitting, Cuff Size: Normal)   Pulse 66   Temp 98.6 F (37 C) (Oral)   Resp 18   Ht '5\' 2"'$  (1.575 m)   Wt 186 lb (84.4 kg)   LMP 04/29/2002 (Approximate) Comment: Hysterectomy 15 yrs ago per pt  SpO2 96%   BMI 34.02 kg/m   Wt Readings from Last 3 Encounters:  01/25/23 186 lb (84.4 kg)  11/28/21 185 lb (83.9 kg)  03/25/21 186 lb (84.4 kg)    Physical Exam Vitals and nursing note  reviewed.  Constitutional:      General: She is not in acute distress.    Appearance: Normal appearance. She is well-developed. She is obese. She is not diaphoretic.     Comments: Well-appearing, comfortable, cooperative  HENT:     Head: Normocephalic and atraumatic.  Eyes:     General:        Right eye: No discharge.        Left eye: No discharge.     Conjunctiva/sclera: Conjunctivae normal.  Cardiovascular:     Rate and Rhythm: Normal rate.  Pulmonary:     Effort: Pulmonary effort is normal.  Skin:    General: Skin is warm and dry.     Findings: No erythema or rash.  Neurological:     Mental Status: She is alert and oriented to person, place, and time.  Psychiatric:        Mood and Affect: Mood normal.        Behavior: Behavior normal.        Thought Content: Thought content normal.     Comments: Well groomed, good eye contact, normal speech and thoughts       Results for orders placed or performed in visit on 01/25/23  POCT glycosylated hemoglobin (Hb  A1C)  Result Value Ref Range   Hemoglobin A1C 5.7 (A) 4.0 - 5.6 %      Assessment & Plan:   Problem List Items Addressed This Visit   None Visit Diagnoses     Obesity (BMI 30.0-34.9)    -  Primary   Relevant Orders   POCT glycosylated hemoglobin (Hb A1C) (Completed)   Abnormal glucose       Relevant Orders   POCT glycosylated hemoglobin (Hb A1C) (Completed)       Weight Management Obesity  BMI >34  Failed lifestyle diet exercise management  Start trial on Contrave oral, sample x 7 days AVS info given on other options GLP1 vs Rx Contrave  A1c today shows 5.7%, pre diabetic range  She will notify me which option she prefers based on insurance coverage  Update 3/7 Ordered Zepbound 2.5 mg weekly inj per patient request, she said it is covered if PA  No orders of the defined types were placed in this encounter.     Follow up plan: Return in about 3 months (around 04/27/2023) for 3 month Weight  Management.   Nobie Putnam, [facility] 01/25/2023, 4:09 PM

## 2023-01-25 NOTE — Patient Instructions (Addendum)
Thank you for coming to the office today.  Recent Labs    01/25/23 1643  HGBA1C 5.7*     Call insurance find cost and coverage of the following - check the following: - Drug Tier, Preferred List, On Formulary - All will require a "Prior Authorization" from [place] first, before you can find out the cost - Find out if there is "Step Therapy" (other medicines required before you can try these)  Once you pick the one you want to try, let me know - we can get a sample ready IF we have it in stock. Then try it - and before running out of medicine, contact me back to order your Rx so we have time to get it processed.  For Weight Loss / Obesity only  Wegovy (same as Ozempic) weekly injection - start 0.'25mg'$  weekly, 1 dose per pen, single use, auto-injector  2. Saxenda - DAILY injection - start 0.'6mg'$  injection DAILY, you can increase the dose by 1 notch or 0.6 mg per week, if you don't tolerate a dose, can reduce it the next day.  3. Zepbound - (same as Mounjaro) - WEEKLY injection, best available.  Contrave - oral medication, appetite suppression has wellbutrin/bupropion and naltrexone in it and it can also help with appetite, it is ordered through a speciality pharmacy. - $99  Sample 1 week  Future make sure your insurance has weight loss coverage  Let me know how to proceed.   Please schedule a Follow-up Appointment to: Return in about 3 months (around 04/27/2023) for 3 month Weight Management.  If you have any other questions or concerns, please feel free to call the office or send a message through [place]. You may also schedule an earlier appointment if necessary.  Additionally, you may be receiving a survey about your experience at our office within a few days to 1 week by e-mail or mail. We value your feedback.  Nobie Putnam, DO [place]

## 2023-01-28 ENCOUNTER — Telehealth: Payer: Self-pay | Admitting: Family Medicine

## 2023-01-28 ENCOUNTER — Telehealth: Payer: Self-pay

## 2023-01-28 MED ORDER — ZEPBOUND 2.5 MG/0.5ML ~~LOC~~ SOAJ: 2.5000 mg | SUBCUTANEOUS | 0 refills | Status: DC

## 2023-01-28 NOTE — Telephone Encounter (Signed)
Copied from [place] [phone]. Topic: General - Other >> Jan 26, 2023  8:50 AM Chapman Fitch wrote: Reason for CRM: Pt called to speak with Nurse or Dr. Raliegh Ip about prescription coverage/ Dr. Raliegh Ip wanted pt to find out this information from her insurance / please advise

## 2023-01-28 NOTE — Addendum Note (Signed)
Addended by: Olin Hauser on: 01/28/2023 08:15 AM   Modules accepted: Orders

## 2023-02-01 ENCOUNTER — Telehealth: Payer: Self-pay | Admitting: Family Medicine

## 2023-02-01 NOTE — Telephone Encounter (Signed)
Patient called in states needs PA for med, Zepbound 2.5 MG/0.5ML Pen

## 2023-02-03 ENCOUNTER — Other Ambulatory Visit: Payer: Self-pay | Admitting: Cardiovascular Disease

## 2023-02-03 NOTE — Telephone Encounter (Signed)
The patient has called to follow up on their previous request for confirmation of their prior authorization's completion   Please contact when possible

## 2023-02-03 NOTE — Telephone Encounter (Signed)
It is in process. I will be in touch with her when I receive a notification of approval or denial.

## 2023-02-04 NOTE — Telephone Encounter (Signed)
Please contact pt for future appointment. Pt OD for 12 month f/u. Pt needing refills.

## 2023-02-04 NOTE — Telephone Encounter (Signed)
Received Update: The PA for Zepbound has been denied due to, "plan exclusion" which means that weight loss medications are not covered.  Please notify patient that weight loss rx medications in this injectable category are not covered.  Next option is Contrave oral tablet mail order specialty pharmacy $99 per month, regardless of insurance coverage.  Let me know if she would like me to order this.  Nobie Putnam, [facility] 02/04/2023, 3:54 PM

## 2023-02-04 NOTE — Telephone Encounter (Signed)
Pt scheduled on 7/1

## 2023-02-25 ENCOUNTER — Encounter: Payer: Self-pay | Admitting: Family Medicine

## 2023-03-12 DIAGNOSIS — E785 Hyperlipidemia, unspecified: Secondary | ICD-10-CM | POA: Diagnosis not present

## 2023-03-12 DIAGNOSIS — I10 Essential (primary) hypertension: Secondary | ICD-10-CM | POA: Diagnosis not present

## 2023-03-22 ENCOUNTER — Ambulatory Visit (INDEPENDENT_AMBULATORY_CARE_PROVIDER_SITE_OTHER): Payer: BC Managed Care – PPO | Admitting: Family Medicine

## 2023-03-22 ENCOUNTER — Encounter: Payer: Self-pay | Admitting: Family Medicine

## 2023-03-22 VITALS — BP 132/84 | HR 70 | Temp 99.7°F | Ht 62.0 in | Wt 182.0 lb

## 2023-03-22 DIAGNOSIS — J0110 Acute frontal sinusitis, unspecified: Secondary | ICD-10-CM

## 2023-03-22 DIAGNOSIS — B379 Candidiasis, unspecified: Secondary | ICD-10-CM

## 2023-03-22 MED ORDER — AMOXICILLIN-POT CLAVULANATE 875-125 MG PO TABS
1.0000 | ORAL_TABLET | Freq: Two times a day (BID) | ORAL | 0 refills | Status: DC
Start: 2023-03-22 — End: 2023-04-06

## 2023-03-22 MED ORDER — FLUCONAZOLE 150 MG PO TABS
ORAL_TABLET | ORAL | 0 refills | Status: DC
Start: 2023-03-22 — End: 2023-04-06

## 2023-03-22 MED ORDER — PREDNISONE 20 MG PO TABS
ORAL_TABLET | ORAL | 0 refills | Status: DC
Start: 2023-03-22 — End: 2023-04-06

## 2023-03-22 MED ORDER — GUAIFENESIN-CODEINE 100-10 MG/5ML PO SYRP
5.0000 mL | ORAL_SOLUTION | Freq: Four times a day (QID) | ORAL | 0 refills | Status: DC | PRN
Start: 2023-03-22 — End: 2023-04-06

## 2023-03-22 NOTE — Progress Notes (Signed)
Subjective:    Patient ID: Carla [name], female    DOB: [birth year], 62 y.o.   MRN: [id number]  KERY HALTIWANGER is a 62 y.o. female presenting on 03/22/2023 for URI  Patient presents for a same day appointment.  HPI  Sinusitis Reports onset >1 week ago with sinus congestion drainage and thought it was allergy, over weekend worse with sneezing, coughing, congestion. Worsening symptoms with pressure drainage, cough at night. Not productive. No recent sick contacts Denies fever chills sweats      01/25/2023    4:01 PM  Depression screen PHQ 2/9  Decreased Interest 0  Down, Depressed, Hopeless 0  PHQ - 2 Score 0  Altered sleeping 0  Tired, decreased energy 0  Change in appetite 0  Feeling bad or failure about yourself  0  Trouble concentrating 0  Moving slowly or fidgety/restless 0  Suicidal thoughts 0  PHQ-9 Score 0  Difficult doing work/chores Not difficult at all    Social History   Tobacco Use   Smoking status: Never   Smokeless tobacco: Never  Vaping Use   Vaping Use: Never used  Substance Use Topics   Alcohol use: Yes    Alcohol/week: 1.0 standard drink of alcohol    Types: 1 Standard drinks or equivalent per week    Comment: rarely   Drug use: No    Review of Systems Per HPI unless specifically indicated above     Objective:    BP 132/84   Pulse 70   Temp 99.7 F (37.6 C) (Oral)   Ht 5\' 2"  (1.575 m)   Wt 182 lb (82.6 kg)   LMP 04/29/2002 (Approximate) Comment: Hysterectomy 15 yrs ago per pt  SpO2 96%   BMI 33.29 kg/m   Wt Readings from Last 3 Encounters:  03/22/23 182 lb (82.6 kg)  01/25/23 186 lb (84.4 kg)  11/28/21 185 lb (83.9 kg)    Physical Exam Vitals and nursing note reviewed.  Constitutional:      General: She is not in acute distress.    Appearance: Normal appearance. She is well-developed. She is not diaphoretic.     Comments: Well-appearing, comfortable, cooperative  HENT:     Head: Normocephalic and atraumatic.  Eyes:      General:        Right eye: No discharge.        Left eye: No discharge.     Conjunctiva/sclera: Conjunctivae normal.  Neck:     Thyroid: No thyromegaly.  Cardiovascular:     Rate and Rhythm: Normal rate and regular rhythm.     Heart sounds: Normal heart sounds. No murmur heard. Pulmonary:     Effort: Pulmonary effort is normal. No respiratory distress.     Breath sounds: Normal breath sounds. No wheezing or rales.  Musculoskeletal:        General: Normal range of motion.     Cervical back: Normal range of motion and neck supple.  Lymphadenopathy:     Cervical: No cervical adenopathy.  Skin:    General: Skin is warm and dry.     Findings: No erythema or rash.  Neurological:     Mental Status: She is alert and oriented to person, place, and time.  Psychiatric:        Mood and Affect: Mood normal.        Behavior: Behavior normal.        Thought Content: Thought content normal.     Comments: Well groomed, good  eye contact, normal speech and thoughts      Results for orders placed or performed in visit on 01/25/23  POCT glycosylated hemoglobin (Hb A1C)  Result Value Ref Range   Hemoglobin A1C 5.7 (A) 4.0 - 5.6 %      Assessment & Plan:   Problem List Items Addressed This Visit   None Visit Diagnoses     Acute non-recurrent frontal sinusitis    -  Primary   Relevant Medications   amoxicillin-clavulanate (AUGMENTIN) 875-125 MG tablet   fluconazole (DIFLUCAN) 150 MG tablet   predniSONE (DELTASONE) 20 MG tablet   guaiFENesin-codeine (ROBITUSSIN AC) 100-10 MG/5ML syrup   Antibiotic-induced yeast infection       Relevant Medications   fluconazole (DIFLUCAN) 150 MG tablet       Consistent with acute frontal sinusitis, likely initially viral URI vs allergic rhinitis component with worsening concern for bacterial infection.  >1 week  Plan: 1. Start Augmentin 875-125mg  PO BID x 10 days 2. Prednisone taper 3. Codeine Cough Syrup 4. OTC Flonase after steroid 5. May  continue Zyrtec if prefer Return criteria reviewed  Add diflucan if need due to antibiotic.   Meds ordered this encounter  Medications   amoxicillin-clavulanate (AUGMENTIN) 875-125 MG tablet    Sig: Take 1 tablet by mouth 2 (two) times daily.    Dispense:  20 tablet    Refill:  0   fluconazole (DIFLUCAN) 150 MG tablet    Sig: Take one tablet by mouth on Day 1. Repeat dose 2nd tablet on Day 3.    Dispense:  2 tablet    Refill:  0   predniSONE (DELTASONE) 20 MG tablet    Sig: Take daily with food. Start with 60mg  (3 pills) x 2 days, then reduce to 40mg  (2 pills) x 2 days, then 20mg  (1 pill) x 3 days    Dispense:  13 tablet    Refill:  0   guaiFENesin-codeine (ROBITUSSIN AC) 100-10 MG/5ML syrup    Sig: Take 5-10 mLs by mouth 4 (four) times daily as needed for cough.    Dispense:  180 mL    Refill:  0     Follow up plan: Return if symptoms worsen or fail to improve.  Saralyn Pilar, DO [facility] 03/22/2023, 3:50 PM

## 2023-03-22 NOTE — Patient Instructions (Addendum)
Thank you for coming to the office today.  1. It sounds like you have a Sinusitis (Bacterial Infection) - this most likely started as an Upper Respiratory Virus that has settled into an infection. Allergies can also cause this. - Start Augmentin 1 pill twice daily (breakfast and dinner, with food and plenty of water) for 10 days, complete entire course, do not stop early even if feeling better - Start nasal steroid Flonase 2 sprays in each nostril daily for 4-6 weeks, may repeat course seasonally or as needed - May take Zyrtec or hold it for now - Cough syrup at night, codeine - Improve hydration by drinking plenty of clear fluids (water, gatorade) to reduce secretions and thin congestion - Congestion draining down throat can cause irritation. May try warm herbal tea with honey, cough drops - Can take Tylenol or Ibuprofen as needed for fevers  Pause Coricidin HBP  If you develop persistent fever >101F for at least 3 consecutive days, headaches with sinus pain or pressure or persistent earache, please schedule a follow-up evaluation within next few days to week.   Please schedule a Follow-up Appointment to: Return if symptoms worsen or fail to improve.  If you have any other questions or concerns, please feel free to call the office or send a message through MyChart. You may also schedule an earlier appointment if necessary.  Additionally, you may be receiving a survey about your experience at our office within a few days to 1 week by e-mail or mail. We value your feedback.  Saralyn Pilar, DO [facility], [place]

## 2023-04-05 NOTE — Progress Notes (Signed)
Cardiology Office Note  Date:  04/06/2023   ID:  Carla [name], DOB June 27, 1961, MRN [id number]  PCP:  Smitty Cords, DO   Chief Complaint  Patient presents with   12 month follow up     "Doing well." Medications reviewed by the patient verballly.     HPI:  Carla [name] is a 62 -year-old  woman with a history of  bradycardia,  hyperlipidemia,  hypertension  2.7 cm mixed cystic solid right thyroid nodule. Carotid: Minor atherosclerotic plaque formation  2018 Calcium score 0 June 2016 she had CT coronary calcium who presents for routine followup of her hyperlipidemia and bradycardia  Last seen by myself in clinic January 2023  In follow-up today reports doing relatively well Followed by endocrine, Dr. 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    04/26/17  chest pain/tightness  3 episodes Called ems EKG not acute BP was noted to be 170/90.  Stress test 04/2017: no ischemia  Carotid u/s Minor atherosclerotic plaque formation.   2.7 cm mixed cystic solid right thyroid nodule.  PMH:   has a past medical history of Anxiety, Depression, HLD (hyperlipidemia), HTN (hypertension), Thyroid disease, and Venous insufficiency.  PSH:    Past Surgical History:  Procedure Laterality Date    BIOPSY THYROID     BUNIONECTOMY     hysterctomy      Current Outpatient Medications  Medication Sig Dispense Refill   calcium carbonate (OS-CAL) 600 MG TABS Take 600 mg by mouth daily.     cetirizine (ZYRTEC) 10 MG tablet      fluticasone (FLONASE) 50 MCG/ACT nasal spray Place 2 sprays into both nostrils daily. 16 g 0   Lactobacillus (PROBIOTIC ACIDOPHILUS PO) Take by mouth daily.     Lifitegrast (XIIDRA) 5 % SOLN Apply 1 drop to eye 2 (two) times daily.     Multiple Vitamin (MULTIVITAMIN) tablet Take 1 tablet by mouth daily.     Omega-3 Fatty Acids (FISH OIL) 1200 MG CAPS Take 1 capsule by mouth daily.     Polyvinyl Alcohol-Povidone (TEARS PLUS OP) Apply 1 drop to eye 2 (two) times daily.     VEOZAH 45 MG TABS Take 45 mg by mouth daily.     vitamin E 400 UNIT capsule Take 400 Units by mouth daily.     ezetimibe-simvastatin (VYTORIN) 10-40 MG tablet Take 1 tablet by mouth daily. 90 tablet 3   hydrochlorothiazide (HYDRODIURIL) 25 MG tablet Take 1 tablet (25 mg total) by mouth daily as needed. For swelling, fluid retention, high pressure >140. Take OTC potassium when taking (Patient not taking: Reported on 04/06/2023) 90 tablet 3   losartan (COZAAR) 100 MG tablet Take 1 tablet (100 mg total) by mouth daily. 90 tablet 3   ZEPBOUND 2.5  MG/0.5ML Pen Inject 2.5 mg into the skin once a week. (Patient not taking: Reported on 04/06/2023) 2 mL 0   No current facility-administered medications for this visit.    Allergies:   Pseudoephedrine and Pseudoephedrine hcl   Social History:  The patient  reports that she has never smoked. She has never used smokeless tobacco. She reports current alcohol use of about 1.0 standard drink of alcohol per week. She reports that she does not use drugs.   Family History:   family history includes Atrial fibrillation in her father; CAD in her father; Cancer in her mother; Heart disease in her father; Hyperlipidemia in her father.    Review of Systems: Review of  Systems  Constitutional: Negative.   Respiratory: Negative.    Cardiovascular: Negative.   Gastrointestinal: Negative.   Musculoskeletal: Negative.   Neurological: Negative.   Psychiatric/Behavioral: Negative.    All other systems reviewed and are negative.    PHYSICAL EXAM: VS:  BP (!) 140/90 (BP Location: Left Arm, Patient Position: Sitting, Cuff Size: Normal)   Pulse (!) 59   Ht 5\' 2"  (1.575 m)   Wt 182 lb (82.6 kg)   LMP 04/29/2002 (Approximate) Comment: Hysterectomy 15 yrs ago per pt  SpO2 98%   BMI 33.29 kg/m  , BMI Body mass index is 33.29 kg/m. Constitutional:  oriented to person, place, and time. No distress.  HENT:  Head: Grossly normal Eyes:  no discharge. No scleral icterus.  Neck: No JVD, no carotid bruits  Cardiovascular: Regular rate and rhythm, no murmurs appreciated Pulmonary/Chest: Clear to auscultation bilaterally, no wheezes or rails Abdominal: Soft.  no distension.  no tenderness.  Musculoskeletal: Normal range of motion Neurological:  normal muscle tone. Coordination normal. No atrophy Skin: Skin warm and dry Psychiatric: normal affect, pleasant  Recent Labs: No results found for requested labs within last 365 days.    Lipid Panel Lab Results  Component Value Date   CHOL 194 04/29/2017   HDL 48 04/29/2017   LDLCALC 108 (H) 04/29/2017   TRIG 190 (H) 04/29/2017      Wt Readings from Last 3 Encounters:  04/06/23 182 lb (82.6 kg)  03/22/23 182 lb (82.6 kg)  01/25/23 186 lb (84.4 kg)     ASSESSMENT AND PLAN:  HYPERTENSION, BENIGN  Blood pressure is well controlled on today's visit. No changes made to the medications.  Other hyperlipidemia  No recent lipid panel available, we have written order to have lipids done at her convenience, fasting Continues to take half dose Vytorin  Atypical chest pain Previous CT coronary calcium score 0 Prior stress test 2018 no ischemia Denies anginal symptoms  Bradycardia No recent symptoms, Avoid  beta-blockers    Total encounter time more than 30 minutes  Greater than 50% was spent in counseling and coordination of care with the patient    Orders Placed This Encounter  Procedures   Lipid panel   Comprehensive metabolic panel   EKG 12-Lead     Signed, Dossie Arbour, M.D., Ph.D. 04/06/2023  [facility] [place], [place] [phone]

## 2023-04-06 ENCOUNTER — Encounter: Payer: Self-pay | Admitting: Cardiovascular Disease

## 2023-04-06 ENCOUNTER — Ambulatory Visit: Payer: BC Managed Care – PPO | Attending: Cardiovascular Disease | Admitting: Cardiovascular Disease

## 2023-04-06 VITALS — BP 140/90 | HR 59 | Ht 62.0 in | Wt 182.0 lb

## 2023-04-06 DIAGNOSIS — R0789 Other chest pain: Secondary | ICD-10-CM

## 2023-04-06 DIAGNOSIS — E782 Mixed hyperlipidemia: Secondary | ICD-10-CM

## 2023-04-06 DIAGNOSIS — I10 Essential (primary) hypertension: Secondary | ICD-10-CM

## 2023-04-06 DIAGNOSIS — Z79899 Other long term (current) drug therapy: Secondary | ICD-10-CM

## 2023-04-06 DIAGNOSIS — R0602 Shortness of breath: Secondary | ICD-10-CM | POA: Diagnosis not present

## 2023-04-06 MED ORDER — LOSARTAN POTASSIUM 100 MG PO TABS: 100.0000 mg | ORAL_TABLET | Freq: Every day | ORAL | 3 refills | Status: DC

## 2023-04-06 MED ORDER — EZETIMIBE-SIMVASTATIN 10-40 MG PO TABS: 1.0000 | ORAL_TABLET | Freq: Every day | ORAL | 3 refills | Status: DC

## 2023-04-06 NOTE — Patient Instructions (Addendum)
Medication Instructions:  No changes  If you need a refill on your cardiac medications before your next appointment, please call your pharmacy.   Lab work: Fasting lipids, CMP  Testing/Procedures: No new testing needed  Follow-Up: At [facility], you and your health needs are our priority.  As part of our continuing mission to provide you with exceptional heart care, we have created designated Provider Care Teams.  These Care Teams include your primary Cardiologist (physician) and Advanced Practice Providers (APPs -  Physician Assistants and Nurse Practitioners) who all work together to provide you with the care you need, when you need it.  You will need a follow up appointment in 12 months  Providers on your designated Care Team:   Nicolasa Ducking, NP Eula Listen, PA-C Cadence Fransico [place], [place]  COVID-19 Vaccine Information can be found at: [email or web address] For questions related to vaccine distribution or appointments, please email [email or web address] or call [phone].

## 2023-04-09 DIAGNOSIS — Z79899 Other long term (current) drug therapy: Secondary | ICD-10-CM | POA: Diagnosis not present

## 2023-04-09 DIAGNOSIS — E782 Mixed hyperlipidemia: Secondary | ICD-10-CM | POA: Diagnosis not present

## 2023-04-10 LAB — COMPREHENSIVE METABOLIC PANEL
ALT: 28 IU/L (ref 0–32)
AST: 24 IU/L (ref 0–40)
Albumin/Globulin Ratio: 2.3 — ABNORMAL HIGH (ref 1.2–2.2)
Albumin: 4.6 g/dL (ref 3.9–4.9)
Alkaline Phosphatase: 40 IU/L — ABNORMAL LOW (ref 44–121)
BUN/Creatinine Ratio: 21 (ref 12–28)
BUN: 14 mg/dL (ref 8–27)
Bilirubin Total: 0.6 mg/dL (ref 0.0–1.2)
CO2: 25 mmol/L (ref 20–29)
Calcium: 9.9 mg/dL (ref 8.7–10.3)
Chloride: 102 mmol/L (ref 96–106)
Creatinine, Ser: 0.67 mg/dL (ref 0.57–1.00)
Globulin, Total: 2 g/dL (ref 1.5–4.5)
Glucose: 105 mg/dL — ABNORMAL HIGH (ref 70–99)
Potassium: 4.3 mmol/L (ref 3.5–5.2)
Sodium: 142 mmol/L (ref 134–144)
Total Protein: 6.6 g/dL (ref 6.0–8.5)
eGFR: 99 mL/min/{1.73_m2} (ref >59)

## 2023-04-10 LAB — LIPID PANEL
Chol/HDL Ratio: 3.6 ratio (ref 0.0–4.4)
Cholesterol, Total: 213 mg/dL — ABNORMAL HIGH (ref 100–199)
HDL: 60 mg/dL (ref >39)
LDL Chol Calc (NIH): 122 mg/dL — ABNORMAL HIGH (ref 0–99)
Triglycerides: 175 mg/dL — ABNORMAL HIGH (ref 0–149)
VLDL Cholesterol Cal: 31 mg/dL (ref 5–40)

## 2023-04-20 ENCOUNTER — Other Ambulatory Visit: Payer: Self-pay

## 2023-05-24 ENCOUNTER — Telehealth: Payer: Self-pay | Admitting: Cardiovascular Disease

## 2023-05-24 ENCOUNTER — Ambulatory Visit: Payer: BC Managed Care – PPO | Admitting: Cardiovascular Disease

## 2023-05-24 NOTE — Telephone Encounter (Signed)
Called patient, advised that since she increased her Vytorin to a whole tablet- she states she is having muscle aches. She states that a friend of hers that use to work with Dr.Gollan recommended she take a 2 week holiday to see if symptoms improve, however- they recommended Nexlizet or Repatha since she is having issues.. I advised I would route to MD to review and give recommendations.   Thanks!

## 2023-05-24 NOTE — Telephone Encounter (Signed)
Pt c/o medication issue:  1. Name of Medication: ezetimibe-simvastatin (VYTORIN) 10-40 MG tablet   2. How are you currently taking this medication (dosage and times per day)?    3. Are you having a reaction (difficulty breathing--STAT)? No   4. What is your medication issue? States that she going to stop the medication because of the side effect. She states that it had her feeling back. Please advise

## 2023-05-25 ENCOUNTER — Telehealth: Payer: Self-pay | Admitting: Cardiovascular Disease

## 2023-05-25 MED ORDER — REPATHA SURECLICK 140 MG/ML ~~LOC~~ SOAJ: 140.0000 mg | SUBCUTANEOUS | 3 refills | Status: DC

## 2023-05-25 NOTE — Telephone Encounter (Signed)
Called and spoke with patient. Informed patient of the following recommendation from Dr. Mariah Milling.  We can send in prescription for Repatha 140 if she is willing to try a shot every 2 weeks Thx TG        Patient states that she would like to try Repatha. Prescription sent in to preferred pharmacy.

## 2023-05-25 NOTE — Telephone Encounter (Signed)
Pt c/o medication issue:  1. Name of Medication: Evolocumab (REPATHA SURECLICK) 140 MG/ML SOAJ   2. How are you currently taking this medication (dosage and times per day)?  Inject 140 mg into the skin every 14 (fourteen) days.       3. Are you having a reaction (difficulty breathing--STAT)? No  4. What is your medication issue? Mellody Dance from [employer] called stated a PA is required for this medication that was sent to pharmacy today so he wanted to provide the info for where it should be sent. Fax [phone] and if need to call [phone]. Please advise

## 2023-05-31 ENCOUNTER — Other Ambulatory Visit (HOSPITAL_COMMUNITY): Payer: Self-pay

## 2023-05-31 ENCOUNTER — Telehealth: Payer: Self-pay

## 2023-05-31 NOTE — Telephone Encounter (Signed)
Pharmacy Patient Advocate Encounter  Prior Authorization for REPATHA has been approved.    PA# [id number] Effective dates: THRU 12/01/23   Haze Rushing, CPhT Pharmacy Patient Advocate Specialist Direct Number: [phone] Fax: [phone]

## 2023-05-31 NOTE — Telephone Encounter (Signed)
PA initiated, please see separate encounter for updates on determination. (I will route you back in once a decision has been made)  Jo-Ann Johanning, CPhT Pharmacy Patient Advocate Specialist Direct Number: [phone] Fax: [phone]  

## 2023-05-31 NOTE — Telephone Encounter (Signed)
Spoke to patient and informed her that the prior authorization for the Evolocumab (REPATHA SURECLICK) 140 MG/ML SOAJ has been approved  Pharmacy Patient Advocate Encounter   Prior Authorization for REPATHA has been approved.     PA# [id number] Effective dates: THRU 12/01/23    Haze Rushing, CPhT Pharmacy Patient Advocate Specialist Direct Number: [phone] Fax: [phone]

## 2023-05-31 NOTE — Telephone Encounter (Signed)
Pharmacy Patient Advocate Encounter   Received notification from [facility] that prior authorization for REPATHA 140 MG/ML INJ is needed.    PA submitted on 05/31/23 Key [id number] Status is pending  Haze Rushing, CPhT Pharmacy Patient Advocate Specialist Direct Number: [phone] Fax: [phone]

## 2023-06-18 DIAGNOSIS — M545 Low back pain, unspecified: Secondary | ICD-10-CM | POA: Diagnosis not present

## 2023-08-30 DIAGNOSIS — E041 Nontoxic single thyroid nodule: Secondary | ICD-10-CM | POA: Diagnosis not present

## 2023-08-30 DIAGNOSIS — I10 Essential (primary) hypertension: Secondary | ICD-10-CM | POA: Diagnosis not present

## 2023-08-30 DIAGNOSIS — Z23 Encounter for immunization: Secondary | ICD-10-CM | POA: Diagnosis not present

## 2023-08-30 DIAGNOSIS — D352 Benign neoplasm of pituitary gland: Secondary | ICD-10-CM | POA: Diagnosis not present

## 2023-08-30 DIAGNOSIS — D126 Benign neoplasm of colon, unspecified: Secondary | ICD-10-CM | POA: Diagnosis not present

## 2023-08-30 DIAGNOSIS — E785 Hyperlipidemia, unspecified: Secondary | ICD-10-CM | POA: Diagnosis not present

## 2023-09-06 DIAGNOSIS — M5442 Lumbago with sciatica, left side: Secondary | ICD-10-CM | POA: Diagnosis not present

## 2023-09-13 ENCOUNTER — Telehealth: Payer: Self-pay | Admitting: Cardiovascular Disease

## 2023-09-13 ENCOUNTER — Encounter: Payer: Self-pay | Admitting: Cardiovascular Disease

## 2023-09-13 NOTE — Telephone Encounter (Signed)
Patient dropped off hard copy of lab results from [facility]. Has already sent message via mychart. Would like to discuss possible change of medication due to joint pain. Please contact her. Results placed in Dr. Windell Hummingbird nurse box.

## 2023-09-13 NOTE — Telephone Encounter (Signed)
Spoke w/ pt regarding MyChart message that she sent it. She did not know how to scan in her lab results for review, so she dropped them off. She is concerned that her cholesterol is so high on the Repatha and would like to know if she should either switch to something else or if Dr. Mariah Milling would like to add another med to her regimen. Advised her that Dr. Mariah Milling will review her results and we will call her back w/ his recommendation.

## 2023-09-13 NOTE — Telephone Encounter (Signed)
Results placed on Dr. Windell Hummingbird desk for review.

## 2023-09-16 NOTE — Telephone Encounter (Signed)
Called patient and notified her of the following from Dr. Mariah Milling.  It appears that the Repatha is working well with the Vytorin  Total cholesterol went from 213 down to 168  LDL 122 down to 74  I am looking at the labs that she provided  Not sure if we need to make any big changes, overall happy with the drop   Thx  Tgollan   Patient verbalizes understanding.

## 2023-09-23 DIAGNOSIS — M47816 Spondylosis without myelopathy or radiculopathy, lumbar region: Secondary | ICD-10-CM | POA: Diagnosis not present

## 2023-09-30 DIAGNOSIS — M47816 Spondylosis without myelopathy or radiculopathy, lumbar region: Secondary | ICD-10-CM | POA: Diagnosis not present

## 2023-10-12 DIAGNOSIS — L718 Other rosacea: Secondary | ICD-10-CM | POA: Diagnosis not present

## 2023-10-12 DIAGNOSIS — L738 Other specified follicular disorders: Secondary | ICD-10-CM | POA: Diagnosis not present

## 2023-10-12 DIAGNOSIS — L720 Epidermal cyst: Secondary | ICD-10-CM | POA: Diagnosis not present

## 2023-10-19 ENCOUNTER — Encounter: Payer: Self-pay | Admitting: Cardiovascular Disease

## 2023-12-10 ENCOUNTER — Telehealth: Payer: Self-pay | Admitting: Cardiovascular Disease

## 2023-12-10 ENCOUNTER — Telehealth: Payer: Self-pay | Admitting: Pharmacy Technician

## 2023-12-10 ENCOUNTER — Other Ambulatory Visit (HOSPITAL_COMMUNITY): Payer: Self-pay

## 2023-12-10 NOTE — Telephone Encounter (Signed)
Pharmacy Patient Advocate Encounter   Received notification from Pt Calls Messages that prior authorization for Repatha SureClick 140MG /ML auto-injectors is required/requested.   Insurance verification completed.   The patient is insured through [facility] .   Per test claim: PA required; PA submitted to above mentioned insurance via CoverMyMeds Key/confirmation #/EOC [id number] Status is pending

## 2023-12-10 NOTE — Telephone Encounter (Signed)
Called and spoke with patient. Informed her that I will forward to the prior authorization team. Patient verbalizes understanding.

## 2023-12-10 NOTE — Telephone Encounter (Signed)
Pt c/o medication issue:  1. Name of Medication: Evolocumab (REPATHA SURECLICK) 140 MG/ML SOAJ   2. How are you currently taking this medication (dosage and times per day)? Inject 140 mg into the skin every 14 (fourteen) days.   3. Are you having a reaction (difficulty breathing--STAT)? No  4. What is your medication issue? Patient is needing PA on this medication. Patient stated she is due to take the medication on Monday 12/13/23. Please advise.

## 2023-12-13 ENCOUNTER — Telehealth: Payer: Self-pay | Admitting: Pharmacy Technician

## 2023-12-13 NOTE — Telephone Encounter (Signed)
Pharmacy Patient Advocate Encounter  Received notification from [facility] that Prior Authorization for Repatha SureClick 140MG /ML auto-injectors has been APPROVED from 12/10/23 to 12/09/24. Unable to obtain price due to refill too soon rejection, last fill date 12/11/23 next available fill date2/13/25   PA #/Case ID/Reference #: [id number]

## 2023-12-13 NOTE — Telephone Encounter (Signed)
PA request has been Approved. New Encounter created for follow up. For additional info see Pharmacy Prior Auth telephone encounter from 12/13/23.

## 2023-12-15 DIAGNOSIS — Z1330 Encounter for screening examination for mental health and behavioral disorders, unspecified: Secondary | ICD-10-CM | POA: Diagnosis not present

## 2023-12-15 DIAGNOSIS — E042 Nontoxic multinodular goiter: Secondary | ICD-10-CM | POA: Diagnosis not present

## 2024-01-17 ENCOUNTER — Encounter: Payer: Self-pay | Admitting: *Deleted

## 2024-01-17 ENCOUNTER — Ambulatory Visit
Admission: EM | Admit: 2024-01-17 | Discharge: 2024-01-17 | Disposition: A | Discharge status: Home / Self Care | Payer: BC Managed Care – PPO | Attending: Emergency Medicine | Admitting: Emergency Medicine

## 2024-01-17 DIAGNOSIS — R300 Dysuria: Secondary | ICD-10-CM | POA: Diagnosis not present

## 2024-01-17 DIAGNOSIS — Z79899 Other long term (current) drug therapy: Secondary | ICD-10-CM | POA: Insufficient documentation

## 2024-01-17 DIAGNOSIS — N3000 Acute cystitis without hematuria: Secondary | ICD-10-CM | POA: Diagnosis not present

## 2024-01-17 LAB — URINALYSIS, W/ REFLEX TO CULTURE (INFECTION SUSPECTED)
Bilirubin Urine: NEGATIVE
Glucose, UA: NEGATIVE mg/dL
Ketones, ur: NEGATIVE mg/dL
Leukocytes,Ua: NEGATIVE
Nitrite: NEGATIVE
Protein, ur: NEGATIVE mg/dL
Specific Gravity, Urine: 1.015 (ref 1.005–1.030)
pH: 5.5 (ref 5.0–8.0)

## 2024-01-17 MED ORDER — CEPHALEXIN 500 MG PO CAPS: 500.0000 mg | ORAL_CAPSULE | Freq: Three times a day (TID) | ORAL | 0 refills | Status: AC

## 2024-01-17 NOTE — ED Provider Notes (Signed)
 [facility]    CSN: [id number] Arrival date & time: 01/17/24  1754      History   Chief Complaint Chief Complaint  Patient presents with   Flank Pain   Dysuria    HPI Carla [name] is a 63 y.o. female presenting for dysuria, frequency, urgency, and lower back pain x 4 days.  Denies fever, fatigue, nausea/vomiting, flank pain, hematuria, vaginal discharge/itching or odor.  No history of UTIs but thinks she might have a urinary infection at this time.  HPI  Past Medical History:  Diagnosis Date   Anxiety    Depression    HLD (hyperlipidemia)    HTN (hypertension)    Thyroid disease    Venous insufficiency     Patient Active Problem List   Diagnosis Date Noted   Lipoma of left upper extremity 02/01/2019   Chest pain with moderate risk for cardiac etiology 07/11/2018   Venous insufficiency    Shortness of breath 04/25/2015   Chest tightness 05/22/2014   Hyperlipidemia 02/12/2010   Essential hypertension 02/12/2010    Past Surgical History:  Procedure Laterality Date   BIOPSY THYROID     BUNIONECTOMY     hysterctomy      OB History   No obstetric history on file.      Home Medications    Prior to Admission medications   Medication Sig Start Date End Date Taking? Authorizing Provider  cephALEXin (KEFLEX) 500 MG capsule Take 1 capsule (500 mg total) by mouth 3 (three) times daily for 7 days. 01/17/24 01/24/24 Yes Eusebio Friendly B, PA-C  calcium carbonate (OS-CAL) 600 MG TABS Take 600 mg by mouth daily.    [provider]  cetirizine (ZYRTEC) 10 MG tablet     [provider]  Evolocumab (REPATHA SURECLICK) 140 MG/ML SOAJ Inject 140 mg into the skin every 14 (fourteen) days. 05/25/23   Antonieta Iba, MD  ezetimibe-simvastatin (VYTORIN) 10-40 MG tablet Take 1 tablet by mouth daily. 04/06/23   Antonieta Iba, MD  fluticasone (FLONASE) 50 MCG/ACT nasal spray Place 2 sprays into both nostrils daily. 01/06/22   Freddy Finner, NP   hydrochlorothiazide (HYDRODIURIL) 25 MG tablet Take 1 tablet (25 mg total) by mouth daily as needed. For swelling, fluid retention, high pressure >140. Take OTC potassium when taking Patient not taking: Reported on 04/06/2023 11/28/21   Antonieta Iba, MD  Lactobacillus (PROBIOTIC ACIDOPHILUS PO) Take by mouth daily.    [provider]  Lifitegrast Benay Spice) 5 % SOLN Apply 1 drop to eye 2 (two) times daily.    [provider]  losartan (COZAAR) 100 MG tablet Take 1 tablet (100 mg total) by mouth daily. 04/06/23   Antonieta Iba, MD  Multiple Vitamin (MULTIVITAMIN) tablet Take 1 tablet by mouth daily.    [provider]  Omega-3 Fatty Acids (FISH OIL) 1200 MG CAPS Take 1 capsule by mouth daily.    [provider]  Polyvinyl Alcohol-Povidone (TEARS PLUS OP) Apply 1 drop to eye 2 (two) times daily.    [provider]  VEOZAH 45 MG TABS Take 45 mg by mouth daily.    [provider]  vitamin E 400 UNIT capsule Take 400 Units by mouth daily.    [provider]  ZEPBOUND 2.5 MG/0.5ML Pen Inject 2.5 mg into the skin once a week. Patient not taking: Reported on 04/06/2023 01/28/23   Smitty Cords, DO    Family History Family History  Problem Relation Age of Onset   CAD Father         CABG x 4 in his 102s, passed in his 33s   Atrial fibrillation Father    Hyperlipidemia Father    Heart disease Father    Cancer Mother        nasopharyngeal tumor, 12s   Breast cancer Neg Hx    Colon cancer Neg Hx     Social History Social History   Tobacco Use   Smoking status: Never   Smokeless tobacco: Never  Vaping Use   Vaping status: Never Used  Substance Use Topics   Alcohol use: Yes    Alcohol/week: 1.0 standard drink of alcohol    Types: 1 Standard drinks or equivalent per week    Comment: rarely   Drug use: No     Allergies   Pseudoephedrine and Pseudoephedrine hcl   Review of Systems Review of Systems   Constitutional:  Negative for chills, fatigue and fever.  Gastrointestinal:  Negative for abdominal pain, diarrhea, nausea and vomiting.  Genitourinary:  Positive for dysuria, frequency and urgency. Negative for decreased urine volume, flank pain, hematuria, pelvic pain, vaginal bleeding, vaginal discharge and vaginal pain.  Musculoskeletal:  Positive for back pain.  Skin:  Negative for rash.     Physical Exam Triage Vital Signs ED Triage Vitals  Encounter Vitals Group     BP 01/17/24 1946 123/71     Systolic BP Percentile --      Diastolic BP Percentile --      Pulse Rate 01/17/24 1946 65     Resp 01/17/24 1946 18     Temp 01/17/24 1946 98.2 F (36.8 C)     Temp Source 01/17/24 1946 Oral     SpO2 01/17/24 1946 96 %     Weight 01/17/24 1942 175 lb (79.4 kg)     Height 01/17/24 1942 5\' 2"  (1.575 m)     Head Circumference --      Peak Flow --      Pain Score 01/17/24 1942 5     Pain Loc --      Pain Education --      Exclude from Growth Chart --    No data found.  Updated Vital Signs BP 123/71 (BP Location: Left Arm)   Pulse 65   Temp 98.2 F (36.8 C) (Oral)   Resp 18   Ht 5\' 2"  (1.575 m)   Wt 175 lb (79.4 kg)   LMP 04/29/2002 (Approximate) Comment: Hysterectomy 15 yrs ago per pt  SpO2 96%   BMI 32.01 kg/m    Physical Exam Vitals and nursing note reviewed.  Constitutional:      General: She is not in acute distress.    Appearance: Normal appearance. She is not ill-appearing or toxic-appearing.  HENT:     Head: Normocephalic and atraumatic.  Eyes:     General: No scleral icterus.       Right eye: No discharge.        Left eye: No discharge.     Conjunctiva/sclera: Conjunctivae normal.  Cardiovascular:     Rate and Rhythm: Normal rate and regular rhythm.     Heart sounds: Normal heart sounds.  Pulmonary:     Effort: Pulmonary effort is normal. No respiratory distress.     Breath sounds: Normal breath sounds.  Abdominal:     Palpations: Abdomen is soft.      Tenderness: There is abdominal tenderness (suprapubic).  Musculoskeletal:  Cervical back: Neck supple.  Skin:    General: Skin is dry.  Neurological:     General: No focal deficit present.     Mental Status: She is alert. Mental status is at baseline.     Motor: No weakness.     Gait: Gait normal.  Psychiatric:        Mood and Affect: Mood normal.        Behavior: Behavior normal.      UC Treatments / Results  Labs (all labs ordered are listed, but only abnormal results are displayed) Labs Reviewed  URINALYSIS, W/ REFLEX TO CULTURE (INFECTION SUSPECTED) - Abnormal; Notable for the following components:      Result Value   Hgb urine dipstick SMALL (*)    Bacteria, UA FEW (*)    All other components within normal limits  URINE CULTURE    EKG   Radiology No results found.  Procedures Procedures (including critical care time)  Medications Ordered in UC Medications - No data to display  Initial Impression / Assessment and Plan / UC Course  I have reviewed the triage vital signs and the nursing notes.  Pertinent labs & imaging results that were available during my care of the patient were reviewed by me and considered in my medical decision making (see chart for details).   63 year old female presents for dysuria, frequency, urgency and low back pain.  Urinalysis today shows small hemoglobin, bacteria and white blood cell clumps.  Will send urine for culture and treat for suspected urinary tract infection based on symptoms and urinalysis.  Prescribed Keflex.  Advised to increase fluids and rest.  Reviewed that we may amend treatment based on the culture result.  Reviewed follow-up as needed.  Final Clinical Impressions(s) / UC Diagnoses   Final diagnoses:  Acute cystitis without hematuria  Dysuria     Discharge Instructions      UTI: Based on either symptoms or urinalysis, you may have a urinary tract infection. We will send the urine for culture and  call with results in a few days. Begin antibiotics at this time. Your symptoms should be much improved over the next 2-3 days. Increase rest and fluid intake. If for some reason symptoms are worsening or not improving after a couple of days or the urine culture determines the antibiotics you are taking will not treat the infection, the antibiotics may be changed. Return or go to ER for fever, back pain, worsening urinary pain, discharge, increased blood in urine. May take Tylenol or Motrin OTC for pain relief or consider AZO if no contraindications      ED Prescriptions     Medication Sig Dispense Auth. Provider   cephALEXin (KEFLEX) 500 MG capsule Take 1 capsule (500 mg total) by mouth 3 (three) times daily for 7 days. 21 capsule Shirlee Latch, PA-C      PDMP not reviewed this encounter.   Shirlee Latch, [place] 01/18/24 [phone]

## 2024-01-17 NOTE — ED Triage Notes (Signed)
 Patient states 4 days of lower back pain, urinary frequency with burning and dribbling.

## 2024-01-17 NOTE — Discharge Instructions (Signed)

## 2024-01-18 LAB — URINE CULTURE: Culture: <10000 — AB

## 2024-01-19 ENCOUNTER — Ambulatory Visit: Payer: BC Managed Care – PPO | Admitting: Family Medicine

## 2024-01-28 DIAGNOSIS — N951 Menopausal and female climacteric states: Secondary | ICD-10-CM | POA: Diagnosis not present

## 2024-02-04 ENCOUNTER — Telehealth: Payer: Self-pay | Admitting: Pharmacy Technician

## 2024-02-04 ENCOUNTER — Other Ambulatory Visit (HOSPITAL_COMMUNITY): Payer: Self-pay

## 2024-02-04 ENCOUNTER — Telehealth: Payer: Self-pay | Admitting: Cardiovascular Disease

## 2024-02-04 NOTE — Telephone Encounter (Signed)
 Additionally - Patient noted she will be out of this medication on Monday, 3/17.

## 2024-02-04 NOTE — Telephone Encounter (Signed)
 Patient called to follow-up on the status of her prior authorization for Repatha

## 2024-02-04 NOTE — Telephone Encounter (Signed)
 Pharmacy Patient Advocate Encounter   Received notification from Pt Calls Messages that prior authorization for Repatha is required/requested.   Insurance verification completed.   The patient is insured through [employer] .   Per test claim: PA required; PA submitted to above mentioned insurance via CoverMyMeds Key/confirmation #/EOC [id number] Status is pending

## 2024-02-04 NOTE — Telephone Encounter (Signed)
*  STAT* If patient is at the pharmacy, call can be transferred to refill team.   1. Which medications need to be refilled? (please list name of each medication and dose if known) need prior authorization for Repatha   2. Would you like to learn more about the convenience, safety, & potential cost savings by using the [facility]?     3. Are you open to using the [facility] (Type [facility].   4. Which pharmacy/location (including street and city if local pharmacy) is medication to be sent to? [facility]  [employer] [place]    5. Do they need a 30 day or 90 day supply?  Please call in today- supposed to take it on Monday

## 2024-02-07 ENCOUNTER — Other Ambulatory Visit (HOSPITAL_COMMUNITY): Payer: Self-pay

## 2024-02-07 NOTE — Telephone Encounter (Signed)
 Patient had PA on file- 12/10/23- 12/09/24

## 2024-02-15 ENCOUNTER — Other Ambulatory Visit (HOSPITAL_COMMUNITY): Payer: Self-pay

## 2024-02-15 ENCOUNTER — Telehealth: Payer: Self-pay | Admitting: Cardiovascular Disease

## 2024-02-15 NOTE — Telephone Encounter (Signed)
 Pt c/o medication issue:  1. Name of Medication:   Evolocumab (REPATHA SURECLICK) 140 MG/ML SOAJ   2. How are you currently taking this medication (dosage and times per day)?   As prescribed  3. Are you having a reaction (difficulty breathing--STAT)?   4. What is your medication issue?   Patient stated her new insurance is requiring prior authorization for this medication. Patient wants a call back to confirm medication approved.

## 2024-03-03 ENCOUNTER — Ambulatory Visit: Attending: Cardiology

## 2024-03-03 ENCOUNTER — Ambulatory Visit: Payer: Self-pay | Attending: Cardiology | Admitting: Physician Assistant

## 2024-03-03 ENCOUNTER — Encounter: Payer: Self-pay | Admitting: Cardiology

## 2024-03-03 VITALS — BP 137/78 | HR 66 | Ht 62.0 in | Wt 180.2 lb

## 2024-03-03 DIAGNOSIS — R002 Palpitations: Secondary | ICD-10-CM

## 2024-03-03 DIAGNOSIS — R0602 Shortness of breath: Secondary | ICD-10-CM | POA: Diagnosis not present

## 2024-03-03 DIAGNOSIS — R0789 Other chest pain: Secondary | ICD-10-CM

## 2024-03-03 DIAGNOSIS — I10 Essential (primary) hypertension: Secondary | ICD-10-CM | POA: Diagnosis not present

## 2024-03-03 DIAGNOSIS — E782 Mixed hyperlipidemia: Secondary | ICD-10-CM

## 2024-03-03 NOTE — Patient Instructions (Addendum)
 Medication Instructions:  No changes at this time.   *If you need a refill on your cardiac medications before your next appointment, please call your pharmacy*  Lab Work: None  If you have labs (blood work) drawn today and your tests are completely normal, you will receive your results only by: MyChart Message (if you have MyChart) OR A paper copy in the mail If you have any lab test that is abnormal or we need to change your treatment, we will call you to review the results.  Testing/Procedures: Your physician has requested that you have an echocardiogram. Echocardiography is a painless test that uses sound waves to create images of your heart. It provides your doctor with information about the size and shape of your heart and how well your heart's chambers and valves are working. This procedure takes approximately one hour. There are no restrictions for this procedure. Please do NOT wear cologne, perfume, aftershave, or lotions (deodorant is allowed). Please arrive 15 minutes prior to your appointment time.  Please note: We ask at that you not bring children with you during ultrasound (echo/ vascular) testing. Due to room size and safety concerns, children are not allowed in the ultrasound rooms during exams. Our front office staff cannot provide observation of children in our lobby area while testing is being conducted. An adult accompanying a patient to their appointment will only be allowed in the ultrasound room at the discretion of the ultrasound technician under special circumstances. We apologize for any inconvenience.  Your physician has recommended that you wear a Zio monitor for 14 days.   This monitor is a medical device that records the heart's electrical activity. Doctors most often use these monitors to diagnose arrhythmias. Arrhythmias are problems with the speed or rhythm of the heartbeat. The monitor is a small device applied to your chest. You can wear one while you do your  normal daily activities. While wearing this monitor if you have any symptoms to push the button and record what you felt. Once you have worn this monitor for the period of time provider prescribed (Usually 14 days), you will return the monitor device in the postage paid box. Once it is returned they will download the data collected and provide [place] with a report which the provider will then review and we will call you with those results. Important tips:  Avoid showering during the first 24 hours of wearing the monitor. Avoid excessive sweating to help maximize wear time. Do not submerge the device, no hot tubs, and no swimming pools. Keep any lotions or oils away from the patch. After 24 hours you may shower with the patch on. Take brief showers with your back facing the shower head.  Do not remove patch once it has been placed because that will interrupt data and decrease adhesive wear time. Push the button when you have any symptoms and write down what you were feeling. Once you have completed wearing your monitor, remove and place into box which has postage paid and place in your outgoing mailbox.  If for some reason you have misplaced your box then call our office and we can provide another box and/or mail it off for you.     Follow-Up: At [facility], you and your health needs are our priority.  As part of our continuing mission to provide you with exceptional heart care, our providers are all part of one team.  This team includes your primary Cardiologist (physician) and Advanced Practice Providers  or APPs (Physician Assistants and Nurse Practitioners) who all work together to provide you with the care you need, when you need it.  Your next appointment:   Keep scheduled follow up:  04/07/24 10:00 am Dr. Mariah Milling

## 2024-03-03 NOTE — Progress Notes (Signed)
 Cardiology Office Note    Date:  03/03/2024   ID:  Carla [name], DOB [birth year], MRN [id number]  PCP:  Smitty Cords, DO  Cardiologist:  Julien Nordmann, MD  Electrophysiologist:  None   Chief Complaint: Atypical chest pain  History of Present Illness:   Carla [name] is a 63 y.o. female with history of asymptomatic bradycardia, anxiety, depression, strong family history of CAD, hypertension, hyperlipidemia, thyroid disease, and venous insufficiency who is being seen for atypical chest pain.    Prior calcium score of 0 in 2016. Previously intolerant to Crestor secondary to myalgias. Seen in the office 04/2017 after an episode of chest pain.  Myoview Lexiscan done 04/2017 normal study and low risk.  EF estimated at 55 to 65%.  She was most recently seen in our office by Dr. Karo Rog Milling 04/06/2023 and was overall doing well from a cardiac perspective.  She was continued on ezetimibe-simvastatin 10-40 mg daily and losartan 100 mg daily. Per a telephone note 05/2023 patient was having myalgias on ezetimibe-simvastatin. This was subsequently stopped and Repatha was added.     Today patient reports that she has been having fleeting chest discomfort associated with having to take a deep breath for the past 2 weeks. This occurs in the evening when she is relaxing or in bed before she goes to sleep. No episodes with exertion. She is without symptoms of angina and cardiac decompensation. She denies outright chest pain, shortness of breath, palpitations, lightheadedness, dizziness, and lower extremity edema. She does tell me that for the past several weeks she has been having increased stress in her life with her husband retiring and remodeling their house. She wonders if her symptoms could be related to increased stress and anxiety.   Labs independently reviewed: 08/30/2023- hgb 14.8, HCT 42.7, BUN 18, CR 0.69, K4.4, AST/ALT normal, TC 168, TG 257, HDL 52, LDL 74, TSH/T4 4 wnl  Past Medical  History:  Diagnosis Date   Anxiety    Depression    HLD (hyperlipidemia)    HTN (hypertension)    Thyroid disease    Venous insufficiency     Past Surgical History:  Procedure Laterality Date   BIOPSY THYROID     BUNIONECTOMY     hysterctomy      Current Medications: Current Meds  Medication Sig   calcium carbonate (OS-CAL) 600 MG TABS Take 600 mg by mouth daily.   Evolocumab (REPATHA SURECLICK) 140 MG/ML SOAJ Inject 140 mg into the skin every 14 (fourteen) days.   Lactobacillus (PROBIOTIC ACIDOPHILUS PO) Take by mouth daily.   Lifitegrast (XIIDRA) 5 % SOLN Apply 1 drop to eye 2 (two) times daily.   losartan (COZAAR) 100 MG tablet Take 1 tablet (100 mg total) by mouth daily.   Multiple Vitamin (MULTIVITAMIN) tablet Take 1 tablet by mouth daily.   Omega-3 Fatty Acids (FISH OIL) 1200 MG CAPS Take 1 capsule by mouth daily.   Polyvinyl Alcohol-Povidone (TEARS PLUS OP) Apply 1 drop to eye 2 (two) times daily.   VEOZAH 45 MG TABS Take 45 mg by mouth daily.   vitamin E 400 UNIT capsule Take 400 Units by mouth daily.    Allergies:   Pseudoephedrine and Pseudoephedrine hcl   Social History   Socioeconomic History   Marital status: Married    Spouse name: Not on file   Number of children: 1   Years of education: Not on file   Highest education level: Not on file  Occupational History  Not on file  Tobacco Use   Smoking status: Never   Smokeless tobacco: Never  Vaping Use   Vaping status: Never Used  Substance and Sexual Activity   Alcohol use: Yes    Alcohol/week: 1.0 standard drink of alcohol    Types: 1 Standard drinks or equivalent per week    Comment: rarely   Drug use: No   Sexual activity: Not on file  Other Topics Concern   Not on file  Social History Narrative   Part time; regular exercise.    Social Drivers of Corporate investment banker Strain: Low Risk  (12/14/2023)   Received from [facility]   Overall Financial Resource Strain (CARDIA)     Difficulty of Paying Living Expenses: Not very hard  Food Insecurity: No Food Insecurity (12/14/2023)   Received from [facility]   Hunger Vital Sign    Worried About Running Out of Food in the Last Year: Never true    Ran Out of Food in the Last Year: Never true  Transportation Needs: No Transportation Needs (12/14/2023)   Received from [facility] - Transportation    Lack of Transportation (Medical): No    Lack of Transportation (Non-Medical): No  Physical Activity: Insufficiently Active (12/14/2023)   Received from [facility]   Exercise Vital Sign    Days of Exercise per Week: 3 days    Minutes of Exercise per Session: 30 min  Stress: No Stress Concern Present (12/14/2023)   Received from [facility] Occup[facility] - Occupational Stress Questionnaire    Feeling of Stress : Not at all  Social Connections: Socially Integrated (12/14/2023)   Received from [facility]   Social Network    How would you rate your social network (family, work, friends)?: Good participation with social networks     Family History:  The patient's family history includes Atrial fibrillation in her father; CAD in her father; Cancer in her mother; Heart disease in her father; Hyperlipidemia in her father. There is no history of Breast cancer or Colon cancer.  ROS:   12-point review of systems is negative unless otherwise noted in the HPI.   EKGs/Labs/Other Studies Reviewed:    Studies reviewed were summarized above. The additional studies were reviewed today:  04/29/2017 Myoview Lexiscan Low risk study without sign of ischemia, ejection fraction 55 to 65%  05/10/2015 calcium score Calcium score of 0  EKG:  EKG is ordered today.  The EKG ordered today demonstrates normal sinus rhythm, rate 66, with PVC  Recent Labs: 04/09/2023: ALT 28; BUN 14; Creatinine, Ser 0.67; Potassium 4.3; Sodium 142  Recent Lipid Panel    Component Value Date/Time   CHOL 213 (H)  04/09/2023 0740   TRIG 175 (H) 04/09/2023 0740   HDL 60 04/09/2023 0740   CHOLHDL 3.6 04/09/2023 0740   CHOLHDL 4.0 04/29/2017 0721   VLDL 38 04/29/2017 0721   LDLCALC 122 (H) 04/09/2023 0740    PHYSICAL EXAM:    VS:  BP 137/78   Pulse 66   Ht 5\' 2"  (1.575 m)   Wt 180 lb 3.2 oz (81.7 kg)   LMP 04/29/2002 (Approximate) Comment: Hysterectomy 15 yrs ago per pt  SpO2 95%   BMI 32.96 kg/m   BMI: Body mass index is 32.96 kg/m.  Physical Exam Constitutional:      Appearance: She is well-developed.  Neck:     Vascular: No carotid bruit.  Cardiovascular:  Rate and Rhythm: Normal rate and regular rhythm.     Heart sounds: No murmur heard. Pulmonary:     Effort: Pulmonary effort is normal. No tachypnea.     Breath sounds: Normal breath sounds.  Musculoskeletal:     Right lower leg: No edema.     Left lower leg: No edema.  Skin:    General: Skin is warm and dry.  Neurological:     General: No focal deficit present.     Mental Status: She is alert and oriented to person, place, and time. Mental status is at baseline.  Psychiatric:        Mood and Affect: Mood normal.        Behavior: Behavior normal.    Wt Readings from Last 3 Encounters:  03/03/24 180 lb 3.2 oz (81.7 kg)  01/17/24 175 lb (79.4 kg)  04/06/23 182 lb (82.6 kg)     ASSESSMENT & PLAN:   Atypical chest pain Palpitations Shortness of breath - Patient presents with 2 weeks of chest discomfort occurring at rest and associated need to take a deep breath. She also notes increased stress during this time. She is without symptoms of exertional angina and cardiac decompensation. Prior calcium score of 0 in 2016 and negative Myoview Lexiscan in 2018. Echocardiogram ordered. EKG shows isolated PVC. I suspect that her symptoms could be due to PACs or PVCs that she notices more at rest and worsened by recently increased stress level. Ordered Zio XT 14 days to quantify burden.   Hypertension - BP controlled. Continue  losartan as prescribed.   Hyperlipidemia - Most recent lipid panel with LDL 74. Continue Repatha.     Disposition: Keep follow up with Dr. Drucilla Cumber Milling next month to discuss test results.   Medication Adjustments/Labs and Tests Ordered: Current medicines are reviewed at length with the patient today.  Concerns regarding medicines are outlined above. Medication changes, Labs and Tests ordered today are summarized above and listed in the Patient Instructions accessible in Encounters.   Velora Mediate, PA-C 03/03/2024 3:43 PM     [facility] [place] [address] [place], [place] [address] [phone]

## 2024-03-06 ENCOUNTER — Telehealth: Payer: Self-pay | Admitting: Cardiovascular Disease

## 2024-03-06 NOTE — Telephone Encounter (Signed)
 Patient called in requesting to speak directly with Carla [name]. She says she know her cousin, cardiologist in [place] . She says it's regarding a CTA, but will not provide additional information. Please advise.

## 2024-03-06 NOTE — Telephone Encounter (Signed)
 Spoke with patient and discussed her request. She reports using vapo rub ever night and she recently saw Dr. [place] McQueen. He told her to stop and not use that again. Given this new information she has done some researching so she wanted to cancel the heart monitor and her echocardiogram. She wants to also discuss this with Dr. Gollan again at the scheduled appt. Tried to explain each test but she was very persistent and did not want to do the testing. She wants to wait and see Dr. Gollan at her next visit to discuss these tests and her situation. Advised to call back if she should change her mind. No further needs.

## 2024-03-06 NOTE — Telephone Encounter (Signed)
 Pt husband called in about the same thing. He needs CPT code for Echo and Heart monitor to give to their insurance, please advise.

## 2024-03-06 NOTE — Telephone Encounter (Signed)
 Pt calling back in regards to this matter. Please advise

## 2024-04-03 ENCOUNTER — Other Ambulatory Visit

## 2024-04-04 NOTE — Progress Notes (Unsigned)
 Cardiology Office Note  Date:  04/07/2024   ID:  Carla [name], DOB Dec 04, 1960, MRN [id number]  PCP:  Raina Bunting, DO   Chief Complaint  Patient presents with   12 month follow up     Patient c/o not being able to take a deep breath most days.    HPI:  Carla [name] is a 63 -year-old  woman with a history of  bradycardia,  hyperlipidemia,  hypertension  2.7 cm mixed cystic solid right thyroid  nodule. Carotid: Minor atherosclerotic plaque formation  2018 Calcium  score 0 June 2016 she had CT coronary calcium  who presents for routine followup of her hyperlipidemia and bradycardia  Last seen by myself in clinic 5/24 Seen by our office 4/25  On visit last month reported having some shortness of breath at rest Able to exercise at the gym Echocardiogram was ordered but she canceled as shortness of breath is slightly better but still has not resolved Feels she is having trouble taking a deep breath Denies significant leg swelling, no PND orthopnea  PVCs noted on EKG, asymptomatic  Denies chest pain concerning for angina  Lab work through [employer] [facility] A1C 5.7  Working part-time for a Neurosurgeon on Vytorin  half pill  Calcium  score 0 June 2016 she had CT coronary calcium  scan  EKG personally reviewed by myself on todays visit EKG Interpretation Date/Time:  Friday Apr 07 2024 10:24:52 EDT Ventricular Rate:  64 PR Interval:  150 QRS Duration:  72 QT Interval:  418 QTC Calculation: 431 R Axis:   18  Text Interpretation: Sinus rhythm with frequent Premature ventricular complexes When compared with ECG of 03-Mar-2024 14:46, No significant change was found Confirmed by Belva Boyden [phone]) on 04/07/2024 10:35:46 AM     Other past medical history Previously on Vytorin  5/20 milligrams daily, change to Lipitor for cost reasons Change to Crestor  10 mg daily Back to Vytorin    Other past medical history  Lost her father in September 2014.     04/26/17  chest pain/tightness  3 episodes Called ems EKG not acute BP was noted to be 170/90.  Stress test 04/2017: no ischemia  Carotid u/s Minor atherosclerotic plaque formation.   2.7 cm mixed cystic solid right thyroid  nodule.  PMH:   has a past medical history of Anxiety, Depression, HLD (hyperlipidemia), HTN (hypertension), Thyroid  disease, and Venous insufficiency.  PSH:    Past Surgical History:  Procedure Laterality Date   BIOPSY THYROID      BUNIONECTOMY     hysterctomy      Current Outpatient Medications  Medication Sig Dispense Refill   calcium  carbonate (OS-CAL) 600 MG TABS Take 600 mg by mouth daily.     Evolocumab  (REPATHA  SURECLICK) 140 MG/ML SOAJ Inject 140 mg into the skin every 14 (fourteen) days. 6 mL 3   Lactobacillus (PROBIOTIC ACIDOPHILUS PO) Take by mouth daily.     Lifitegrast (XIIDRA) 5 % SOLN Apply 1 drop to eye 2 (two) times daily.     losartan  (COZAAR ) 100 MG tablet Take 1 tablet (100 mg total) by mouth daily. 90 tablet 3   Multiple Vitamin (MULTIVITAMIN) tablet Take 1 tablet by mouth daily.     Omega-3 Fatty Acids (FISH OIL) 1200 MG CAPS Take 1 capsule by mouth daily.     Polyvinyl Alcohol-Povidone (TEARS PLUS OP) Apply 1 drop to eye 2 (two) times daily.     VEOZAH 45 MG TABS Take 45 mg by mouth daily.     vitamin  E 400 UNIT capsule Take 400 Units by mouth daily.     hydrochlorothiazide  (HYDRODIURIL ) 25 MG tablet Take 1 tablet (25 mg total) by mouth daily as needed. For swelling, fluid retention, high pressure >140. Take OTC potassium when taking (Patient not taking: Reported on 04/06/2023) 90 tablet 3   No current facility-administered medications for this visit.    Allergies:   Pseudoephedrine and Pseudoephedrine hcl   Social History:  The patient  reports that she has never smoked. She has never used smokeless tobacco. She reports current alcohol use of about 1.0 standard drink of alcohol per week. She reports that she does not use drugs.    Family History:   family history includes Atrial fibrillation in her father; CAD in her father; Cancer in her mother; Heart disease in her father; Hyperlipidemia in her father.    Review of Systems: Review of Systems  Constitutional: Negative.   Respiratory:  Positive for shortness of breath.   Cardiovascular: Negative.   Gastrointestinal: Negative.   Musculoskeletal: Negative.   Neurological: Negative.   Psychiatric/Behavioral: Negative.    All other systems reviewed and are negative.  PHYSICAL EXAM: VS:  BP (!) 122/90 (BP Location: Left Arm, Patient Position: Sitting, Cuff Size: Normal)   Pulse 64   Ht 5\' 2"  (1.575 m)   Wt 177 lb 6 oz (80.5 kg)   LMP 04/29/2002 (Approximate) Comment: Hysterectomy 15 yrs ago per pt  SpO2 94%   BMI 32.44 kg/m  , BMI Body mass index is 32.44 kg/m. Constitutional:  oriented to person, place, and time. No distress.  HENT:  Head: Grossly normal Eyes:  no discharge. No scleral icterus.  Neck: No JVD, no carotid bruits  Cardiovascular: Regular rate and rhythm, no murmurs appreciated Pulmonary/Chest: Clear to auscultation bilaterally, no wheezes or rales Abdominal: Soft.  no distension.  no tenderness.  Musculoskeletal: Normal range of motion Neurological:  normal muscle tone. Coordination normal. No atrophy Skin: Skin warm and dry Psychiatric: normal affect, pleasant  Recent Labs: 04/09/2023: ALT 28; BUN 14; Creatinine, Ser 0.67; Potassium 4.3; Sodium 142   Lipid Panel Lab Results  Component Value Date   CHOL 213 (H) 04/09/2023   HDL 60 04/09/2023   LDLCALC 122 (H) 04/09/2023   TRIG 175 (H) 04/09/2023     Wt Readings from Last 3 Encounters:  04/07/24 177 lb 6 oz (80.5 kg)  03/03/24 180 lb 3.2 oz (81.7 kg)  01/17/24 175 lb (79.4 kg)     ASSESSMENT AND PLAN:  HYPERTENSION, BENIGN  Blood pressure is well controlled on today's visit. No changes made to the medications.  Shortness of breath Presenting at rest, does not interfere  with her exercise regiment Echocardiogram previously ordered, she has declined at this time Recommend she try Lasix as needed  Other hyperlipidemia  Recommend she continue half dose Vytorin   Atypical chest pain Previous CT coronary calcium  score 0 Prior stress test 2018 no ischemia Denies anginal symptoms at this time, exercising on a regular basis  Bradycardia No recent symptoms, Avoid beta-blockers Heart rate stable in the 60s    Orders Placed This Encounter  Procedures   EKG 12-Lead     Signed, Juanda Noon, M.D., Ph.D. 04/07/2024  [facility] [place], [place] [phone]

## 2024-04-05 ENCOUNTER — Telehealth: Payer: Self-pay | Admitting: Cardiovascular Disease

## 2024-04-05 NOTE — Telephone Encounter (Signed)
 Pt called back and reports receiving message about needing to reschedule echocardiogram - no evidence of such found in chart.  Pt reminded of appt with TG this Friday.

## 2024-04-05 NOTE — Telephone Encounter (Signed)
 Patient stated she was returning a call regarding rescheduling an echocardiogram.

## 2024-04-07 ENCOUNTER — Encounter: Payer: Self-pay | Admitting: Cardiovascular Disease

## 2024-04-07 ENCOUNTER — Ambulatory Visit: Payer: BC Managed Care – PPO | Attending: Cardiovascular Disease | Admitting: Cardiovascular Disease

## 2024-04-07 VITALS — BP 122/90 | HR 64 | Ht 62.0 in | Wt 177.4 lb

## 2024-04-07 DIAGNOSIS — R002 Palpitations: Secondary | ICD-10-CM

## 2024-04-07 DIAGNOSIS — R0789 Other chest pain: Secondary | ICD-10-CM

## 2024-04-07 DIAGNOSIS — E782 Mixed hyperlipidemia: Secondary | ICD-10-CM

## 2024-04-07 DIAGNOSIS — I10 Essential (primary) hypertension: Secondary | ICD-10-CM | POA: Diagnosis not present

## 2024-04-07 DIAGNOSIS — R0602 Shortness of breath: Secondary | ICD-10-CM | POA: Diagnosis not present

## 2024-04-07 MED ORDER — FUROSEMIDE 20 MG PO TABS
20.0000 mg | ORAL_TABLET | Freq: Every day | ORAL | 3 refills | Status: AC | PRN
Start: 2024-04-07 — End: ?

## 2024-04-07 NOTE — Patient Instructions (Signed)
Medication Instructions:  Lasix 20 mg daily as needed  If you need a refill on your cardiac medications before your next appointment, please call your pharmacy.   Lab work: No new labs needed  Testing/Procedures: No new testing needed  Follow-Up: At [facility], you and your health needs are our priority.  As part of our continuing mission to provide you with exceptional heart care, we have created designated Provider Care Teams.  These Care Teams include your primary Cardiologist (physician) and Advanced Practice Providers (APPs -  Physician Assistants and Nurse Practitioners) who all work together to provide you with the care you need, when you need it.  You will need a follow up appointment in 12 months  Providers on your designated Care Team:   Nicolasa Ducking, NP Eula Listen, PA-C Cadence Fransico [place], [place]  COVID-19 Vaccine Information can be found at: [email or web address] For questions related to vaccine distribution or appointments, please email [email or web address] or call [phone].

## 2024-04-24 ENCOUNTER — Ambulatory Visit (INDEPENDENT_AMBULATORY_CARE_PROVIDER_SITE_OTHER): Admitting: Family Medicine

## 2024-04-24 ENCOUNTER — Encounter: Payer: Self-pay | Admitting: Family Medicine

## 2024-04-24 VITALS — BP 130/84 | HR 70 | Ht 62.0 in | Wt 177.4 lb

## 2024-04-24 DIAGNOSIS — R300 Dysuria: Secondary | ICD-10-CM | POA: Diagnosis not present

## 2024-04-24 DIAGNOSIS — J0110 Acute frontal sinusitis, unspecified: Secondary | ICD-10-CM

## 2024-04-24 DIAGNOSIS — N3001 Acute cystitis with hematuria: Secondary | ICD-10-CM

## 2024-04-24 LAB — POCT URINALYSIS DIPSTICK
Bilirubin, UA: NEGATIVE
Glucose, UA: NEGATIVE
Ketones, UA: NEGATIVE
Leukocytes, UA: NEGATIVE
Nitrite, UA: NEGATIVE
Odor: POSITIVE
Protein, UA: NEGATIVE
Spec Grav, UA: 1.01 (ref 1.010–1.025)
Urobilinogen, UA: 0.2 U/dL
pH, UA: 6 (ref 5.0–8.0)

## 2024-04-24 MED ORDER — IPRATROPIUM BROMIDE 0.06 % NA SOLN: 2.0000 | Freq: Four times a day (QID) | NASAL | 0 refills | Status: AC

## 2024-04-24 MED ORDER — AMOXICILLIN-POT CLAVULANATE 875-125 MG PO TABS: 1.0000 | ORAL_TABLET | Freq: Two times a day (BID) | ORAL | 0 refills | Status: AC

## 2024-04-24 NOTE — Patient Instructions (Addendum)
 Thank you for coming to the office today.  Augmentin  antibiotic twice a day, cover sinus and possible UTI  Start Atrovent nasal spray decongestant 2 sprays in each nostril up to 4 times daily for 7 days  - We sent urine for a culture, we will call you within next few days if we need to change antibiotics - Please drink plenty of fluids, improve hydration over next 1 week  If symptoms worsening, developing nausea / vomiting, worsening back pain, fevers / chills / sweats, then please return for re-evaluation sooner.  If you take AZO OTC - limit this to 2-3 days MAX to avoid affecting kidneys  D-Mannose is a natural supplement that can actually help bind to urinary bacteria and reduce their effectiveness it can help prevent UTI from forming, and may reduce some symptoms. It likely cannot cure an active UTI but it is worth a try and good to prevent them with. Try 500mg  twice a day at a full dose if you want, or check package instructions for more info   Please schedule a Follow-up Appointment to: No follow-ups on file.  If you have any other questions or concerns, please feel free to call the office or send a message through MyChart. You may also schedule an earlier appointment if necessary.  Additionally, you may be receiving a survey about your experience at our office within a few days to 1 week by e-mail or mail. We value your feedback.  Domingo Friend, DO [facility] [place], [place]

## 2024-04-24 NOTE — Progress Notes (Signed)
 Subjective:    Patient ID: Carla [name], female    DOB: Dec 07, 1960, 63 y.o.   MRN: [id number]  Carla [name] is a 63 y.o. female presenting on 04/24/2024 for Urinary Tract Infection  Patient presents for a same day appointment.  HPI  Discussed the use of AI scribe software for clinical note transcription with the patient, who gave verbal consent to proceed.  History of Present Illness   Carla [name] is a 63 year old female who presents with symptoms suggestive of a urinary tract infection and sinus congestion.    Urinary Frequency / Urinary Odor / Dysuria Reports onset symptoms onset about 1 week ago, persistent dysuria, urinary frequency, and urinary odor. Similar to previous UTI No medications tried recently. Last UTI 12/2023, urgent care, given Keflex  but culture did not confirm UTI. Admits some pelvic discomfort Denies nausea vomiting flank pain  Sinusitis Reports onset 1 week, sinus congestion drainage, sinus pressure, post nasal drainage, no significant lower respiratory symptoms. Did not try any OTC meds Denies any fevers or chills, cough shortness of breath  Additional update OBGYN She is currently taking Veozah for hot flashes, which she started recently. It has been effective in alleviating her symptoms, allowing her to sleep better after a year of sleep disturbances due to hot flashes.       04/24/2024    1:27 PM 01/25/2023    4:01 PM  Depression screen PHQ 2/9  Decreased Interest 0 0  Down, Depressed, Hopeless 0 0  PHQ - 2 Score 0 0  Altered sleeping  0  Tired, decreased energy  0  Change in appetite  0  Feeling bad or failure about yourself   0  Trouble concentrating  0  Moving slowly or fidgety/restless  0  Suicidal thoughts  0  PHQ-9 Score  0  Difficult doing work/chores  Not difficult at all       04/24/2024    1:27 PM  GAD 7 : Generalized Anxiety Score  Nervous, Anxious, on Edge 0  Control/stop worrying 0  Worry too much - different things  0  Trouble relaxing 0  Restless 0  Easily annoyed or irritable 0  Afraid - awful might happen 0  Total GAD 7 Score 0    Social History   Tobacco Use  . Smoking status: Never  . Smokeless tobacco: Never  Vaping Use  . Vaping status: Never Used  Substance Use Topics  . Alcohol use: Yes    Alcohol/week: 1.0 standard drink of alcohol    Types: 1 Standard drinks or equivalent per week    Comment: rarely  . Drug use: No    Review of Systems Per HPI unless specifically indicated above     Objective:     BP 130/84 (BP Location: Left Arm, Patient Position: Sitting, Cuff Size: Normal)   Pulse 70   Ht 5\' 2"  (1.575 m)   Wt 177 lb 6 oz (80.5 kg)   LMP 04/29/2002 (Approximate) Comment: Hysterectomy 15 yrs ago per pt  SpO2 95%   BMI 32.44 kg/m   Wt Readings from Last 3 Encounters:  04/24/24 177 lb 6 oz (80.5 kg)  04/07/24 177 lb 6 oz (80.5 kg)  03/03/24 180 lb 3.2 oz (81.7 kg)    Physical Exam Vitals and nursing note reviewed.  Constitutional:      General: She is not in acute distress.    Appearance: Normal appearance. She is well-developed. She is not diaphoretic.  Comments: Well-appearing, comfortable, cooperative  HENT:     Head: Normocephalic and atraumatic.     Right Ear: Tympanic membrane, ear canal and external ear normal. There is no impacted cerumen.     Left Ear: Tympanic membrane, ear canal and external ear normal. There is no impacted cerumen.     Nose: Congestion present.     Mouth/Throat:     Pharynx: No oropharyngeal exudate or posterior oropharyngeal erythema.     Comments: Post nasal drainage Eyes:     General:        Right eye: No discharge.        Left eye: No discharge.     Conjunctiva/sclera: Conjunctivae normal.  Cardiovascular:     Rate and Rhythm: Normal rate.  Pulmonary:     Effort: Pulmonary effort is normal.  Skin:    General: Skin is warm and dry.     Findings: No erythema or rash.  Neurological:     Mental Status: She is alert  and oriented to person, place, and time.  Psychiatric:        Mood and Affect: Mood normal.        Behavior: Behavior normal.        Thought Content: Thought content normal.     Comments: Well groomed, good eye contact, normal speech and thoughts    Results for orders placed or performed in visit on 04/24/24  POCT Urinalysis Dipstick   Collection Time: 04/24/24  1:26 PM  Result Value Ref Range   Color, UA yellow    Clarity, UA clear    Glucose, UA Negative Negative   Bilirubin, UA negative    Ketones, UA negative    Spec Grav, UA 1.010 1.010 - 1.025   Blood, UA moderate    pH, UA 6.0 5.0 - 8.0   Protein, UA Negative Negative   Urobilinogen, UA 0.2 0.2 or 1.0 E.U./dL   Nitrite, UA negative    Leukocytes, UA Negative Negative   Appearance     Odor positive       Assessment & Plan:   Problem List Items Addressed This Visit   None Visit Diagnoses       Acute non-recurrent frontal sinusitis    -  Primary   Relevant Medications   amoxicillin -clavulanate (AUGMENTIN ) 875-125 MG tablet   ipratropium (ATROVENT) 0.06 % nasal spray     Dysuria       Relevant Orders   POCT Urinalysis Dipstick (Completed)   Urine Culture     Acute cystitis with hematuria       Relevant Medications   amoxicillin -clavulanate (AUGMENTIN ) 875-125 MG tablet        Urinary tract infection (UTI) Urinalysis showed small to moderate blood, not definitive for infection. Awaiting urine culture results.  Last UTI 12/2023, treated with Keflex  and it did resolve however had no UTi confirmed on culture Differential includes possible bladder dysfunction if recurrent false alarms occur.  - Prescribed Augmentin  875 mg twice daily for 10 days. - Await urine culture results in 2-3 days to confirm diagnosis and adjust treatment if necessary. - Discussed potential use of D-mannose as a urinary support supplement to prevent future UTIs. - Consider urology referral if recurrent unconfirmed UTIs occur.  Acute  sinusitis Symptoms include nasal congestion and mild sore throat. Examination revealed sinus swelling and clear fluid behind eardrums. Discussed safety of infrequent antibiotic use and choice of Augmentin  for dual coverage. - Prescribed Augmentin  875 mg twice daily for 10 days. -  Recommended Flonase  nasal spray for long-term relief of sinus congestion. - Prescribed Atrovent nasal spray for quick relief of congestion as needed, up to four times daily.        Orders Placed This Encounter  Procedures  . Urine Culture  . POCT Urinalysis Dipstick    Meds ordered this encounter  Medications  . amoxicillin -clavulanate (AUGMENTIN ) 875-125 MG tablet    Sig: Take 1 tablet by mouth 2 (two) times daily.    Dispense:  20 tablet    Refill:  0  . ipratropium (ATROVENT) 0.06 % nasal spray    Sig: Place 2 sprays into both nostrils 4 (four) times daily. For up to 5-7 days then stop.    Dispense:  15 mL    Refill:  0    Follow up plan: Return if symptoms worsen or fail to improve.   Domingo Friend, DO [facility] 04/24/2024, 1:33 PM

## 2024-04-25 LAB — URINE CULTURE
MICRO NUMBER:: 16527428
Result:: NO GROWTH
SPECIMEN QUALITY:: ADEQUATE

## 2024-04-26 ENCOUNTER — Other Ambulatory Visit: Payer: Self-pay | Admitting: Cardiovascular Disease

## 2024-04-26 ENCOUNTER — Ambulatory Visit: Payer: Self-pay | Admitting: Family Medicine

## 2024-05-01 ENCOUNTER — Other Ambulatory Visit: Payer: Self-pay | Admitting: Cardiovascular Disease

## 2024-05-01 ENCOUNTER — Telehealth: Payer: Self-pay | Admitting: Cardiovascular Disease

## 2024-05-01 DIAGNOSIS — R0789 Other chest pain: Secondary | ICD-10-CM

## 2024-05-01 DIAGNOSIS — N951 Menopausal and female climacteric states: Secondary | ICD-10-CM | POA: Diagnosis not present

## 2024-05-01 DIAGNOSIS — E782 Mixed hyperlipidemia: Secondary | ICD-10-CM

## 2024-05-01 DIAGNOSIS — R0602 Shortness of breath: Secondary | ICD-10-CM

## 2024-05-01 NOTE — Telephone Encounter (Signed)
*  STAT* If patient is at the pharmacy, call can be transferred to refill team.   1. Which medications need to be refilled? (please list name of each medication and dose if known) Evolocumab  (REPATHA  SURECLICK) 140 MG/ML SOAJ    2. Would you like to learn more about the convenience, safety, & potential cost savings by using the [facility]?      3. Are you open to using the [facility] (Type [facility].  ).   4. Which pharmacy/location (including street and city if local pharmacy) is medication to be sent to?  [facility] [place], [place] - [address]     5. Do they need a 30 day or 90 day supply? 30 day

## 2024-05-02 MED ORDER — REPATHA SURECLICK 140 MG/ML ~~LOC~~ SOAJ: 140.0000 mg | SUBCUTANEOUS | 5 refills | Status: DC

## 2024-05-30 DIAGNOSIS — R42 Dizziness and giddiness: Secondary | ICD-10-CM | POA: Diagnosis not present

## 2024-05-30 DIAGNOSIS — H9042 Sensorineural hearing loss, unilateral, left ear, with unrestricted hearing on the contralateral side: Secondary | ICD-10-CM | POA: Diagnosis not present

## 2024-06-01 DIAGNOSIS — R208 Other disturbances of skin sensation: Secondary | ICD-10-CM | POA: Diagnosis not present

## 2024-06-01 DIAGNOSIS — D2272 Melanocytic nevi of left lower limb, including hip: Secondary | ICD-10-CM | POA: Diagnosis not present

## 2024-06-01 DIAGNOSIS — D2261 Melanocytic nevi of right upper limb, including shoulder: Secondary | ICD-10-CM | POA: Diagnosis not present

## 2024-06-01 DIAGNOSIS — D2271 Melanocytic nevi of right lower limb, including hip: Secondary | ICD-10-CM | POA: Diagnosis not present

## 2024-06-01 DIAGNOSIS — L820 Inflamed seborrheic keratosis: Secondary | ICD-10-CM | POA: Diagnosis not present

## 2024-06-01 DIAGNOSIS — L2989 Other pruritus: Secondary | ICD-10-CM | POA: Diagnosis not present

## 2024-06-01 DIAGNOSIS — D2262 Melanocytic nevi of left upper limb, including shoulder: Secondary | ICD-10-CM | POA: Diagnosis not present

## 2024-06-01 DIAGNOSIS — L821 Other seborrheic keratosis: Secondary | ICD-10-CM | POA: Diagnosis not present

## 2024-06-01 DIAGNOSIS — D225 Melanocytic nevi of trunk: Secondary | ICD-10-CM | POA: Diagnosis not present

## 2024-06-01 DIAGNOSIS — L538 Other specified erythematous conditions: Secondary | ICD-10-CM | POA: Diagnosis not present

## 2024-09-05 DIAGNOSIS — E785 Hyperlipidemia, unspecified: Secondary | ICD-10-CM | POA: Diagnosis not present

## 2024-09-05 DIAGNOSIS — D126 Benign neoplasm of colon, unspecified: Secondary | ICD-10-CM | POA: Diagnosis not present

## 2024-09-05 DIAGNOSIS — E041 Nontoxic single thyroid nodule: Secondary | ICD-10-CM | POA: Diagnosis not present

## 2024-09-05 DIAGNOSIS — I10 Essential (primary) hypertension: Secondary | ICD-10-CM | POA: Diagnosis not present

## 2024-09-05 DIAGNOSIS — D352 Benign neoplasm of pituitary gland: Secondary | ICD-10-CM | POA: Diagnosis not present

## 2024-09-19 DIAGNOSIS — N951 Menopausal and female climacteric states: Secondary | ICD-10-CM | POA: Diagnosis not present

## 2024-09-19 DIAGNOSIS — R350 Frequency of micturition: Secondary | ICD-10-CM | POA: Diagnosis not present

## 2024-10-16 ENCOUNTER — Telehealth: Payer: Self-pay | Admitting: Cardiovascular Disease

## 2024-10-16 DIAGNOSIS — E782 Mixed hyperlipidemia: Secondary | ICD-10-CM

## 2024-10-16 DIAGNOSIS — R0602 Shortness of breath: Secondary | ICD-10-CM

## 2024-10-16 DIAGNOSIS — R0789 Other chest pain: Secondary | ICD-10-CM

## 2024-10-16 NOTE — Telephone Encounter (Signed)
 Pt calling to fu on refill request  Please advise.

## 2024-10-30 ENCOUNTER — Ambulatory Visit: Admitting: Urology

## 2024-12-11 ENCOUNTER — Other Ambulatory Visit: Payer: Self-pay

## 2024-12-11 ENCOUNTER — Telehealth: Payer: Self-pay | Admitting: Cardiovascular Disease

## 2024-12-11 DIAGNOSIS — R0602 Shortness of breath: Secondary | ICD-10-CM

## 2024-12-11 DIAGNOSIS — R0789 Other chest pain: Secondary | ICD-10-CM

## 2024-12-11 DIAGNOSIS — E782 Mixed hyperlipidemia: Secondary | ICD-10-CM

## 2024-12-11 MED ORDER — REPATHA SURECLICK 140 MG/ML ~~LOC~~ SOAJ: 140.0000 mg | SUBCUTANEOUS | 1 refills | Status: DC

## 2024-12-11 NOTE — Telephone Encounter (Signed)
 Pt c/o medication issue:  1. Name of Medication:   Evolocumab  (REPATHA  SURECLICK) 140 MG/ML SOAJ    2. How are you currently taking this medication (dosage and times per day)? As written  3. Are you having a reaction (difficulty breathing--STAT)? no  4. What is your medication issue? Pt states Repatha  needs prior auth.

## 2024-12-11 NOTE — Telephone Encounter (Signed)
 Called patient. Left detailed voicemail per DPR indicating repatha  needed to be authorized for refill. Refill order placed to pharmacy. Told patient to call us  back if she needed anything else.

## 2024-12-12 ENCOUNTER — Telehealth: Payer: Self-pay | Admitting: Pharmacy Technician

## 2024-12-12 ENCOUNTER — Other Ambulatory Visit (HOSPITAL_COMMUNITY): Payer: Self-pay

## 2024-12-12 NOTE — Telephone Encounter (Signed)
 Prior authorization was denied. They asked us  to fax more information to:    Faxed 12/12/24

## 2024-12-12 NOTE — Telephone Encounter (Signed)
 Patient is requesting for provider to contact insurance company stating the Repatha  is working the best for her. She stated insurance stated this should be urgent if not patient may not receive medication for up to two week and patient is currently out of Repatha  and has not had it in over a week.      [employer]  [id number]  option 4

## 2024-12-12 NOTE — Telephone Encounter (Signed)
 PA request has been Submitted. New Encounter has been or will be created for follow up. For additional info see Pharmacy Prior Auth telephone encounter from 12/12/24.

## 2024-12-12 NOTE — Telephone Encounter (Signed)
" ° °  Pharmacy Patient Advocate Encounter   Received notification from Pt Calls Messages that prior authorization for repatha  is required/requested.   Insurance verification completed.   The patient is insured through [facility] MEDICAID.   Per test claim: PA required; PA submitted to above mentioned insurance via Latent Key/confirmation #/EOC [id number] Status is pending  "

## 2024-12-12 NOTE — Telephone Encounter (Signed)
 Patient is requesting for a letter  stating that Repatha  has worked the best for her and she has tried other medications

## 2024-12-13 NOTE — Telephone Encounter (Signed)
 Pt calling to f/u on this approval. She says she was told the doctor has to say it's an exception. She ask someone let her know when they know something. Please advise.

## 2024-12-13 NOTE — Telephone Encounter (Signed)
 They denied the expedited appeal and they made the appeal a standard one. They said should have a decision by 01/11/25

## 2024-12-14 NOTE — Telephone Encounter (Signed)
 Patient is calling asking for a call back to receive advice on what she should do, she stated sh cannot go that long without the medication and is having concerns.

## 2024-12-14 NOTE — Telephone Encounter (Signed)
 We will just have to wait until the appeals comes back. Medication is still working up to 4 weeks after taking. Not sure if [place] office has samples

## 2024-12-14 NOTE — Telephone Encounter (Signed)
 Called patient to advise on status and that the office does not have samples for Repatha  - she stated that insurance has sent a fax this morning - I have alerted the front desk staff to look for ir  Dr. Gollan - patient has been without repatha  for over a month - please advise if any interim treatment medication should be sent for patient

## 2024-12-15 ENCOUNTER — Telehealth: Payer: Self-pay | Admitting: Cardiovascular Disease

## 2024-12-15 NOTE — Telephone Encounter (Signed)
 Called patient and notified her of the following from Dr. Gollan.  Should be fine to wait on the appeal No different medication needed in the meantime Thx TG  Patient verbalizes understanding.

## 2024-12-15 NOTE — Telephone Encounter (Signed)
 Pt c/o medication issue:  1. Name of Medication:   Evolocumab  (REPATHA  SURECLICK) 140 MG/ML SOAJ   2. How are you currently taking this medication (dosage and times per day)?   3. Are you having a reaction (difficulty breathing--STAT)?   4. What is your medication issue?   Caller Kathye) stated they will need a prior authorization for this medication sent to [facility], fax number -  [phone] or online at [facility], reference# [id number].  Caller noted patient has been without this medication for 2 weeks and wants to know if patient can get assistance getting medication while in this process.

## 2024-12-18 ENCOUNTER — Other Ambulatory Visit (HOSPITAL_COMMUNITY): Payer: Self-pay

## 2024-12-18 NOTE — Telephone Encounter (Signed)
" ° °  More notes in 12/12/24 encounter "

## 2024-12-18 NOTE — Telephone Encounter (Signed)
" ° ° °  But this is in appeals and notes below that patient was spoken too about waiting on appeals "

## 2024-12-19 ENCOUNTER — Other Ambulatory Visit (HOSPITAL_COMMUNITY): Payer: Self-pay

## 2024-12-19 NOTE — Telephone Encounter (Signed)
 Brittany from [place] is requesting that we call the patient to inform the patient if the prior shara has been submitted. Please advise.

## 2024-12-19 NOTE — Telephone Encounter (Signed)
" °  The patient is requesting a copy of her prior authorization request and would like to pick it up at the office. She stated that she has been trying to get this done for several weeks and would like to see if she can complete it herself. She just needs a copy of the prior authorization request. She would like to get a call back today "

## 2024-12-21 ENCOUNTER — Other Ambulatory Visit (HOSPITAL_COMMUNITY): Payer: Self-pay

## 2024-12-21 NOTE — Telephone Encounter (Signed)
 Pt called, wants to know if she could receive a c/b in regards to refilling her repatha . Insurance has been updated to [employer]. Please advise.

## 2024-12-21 NOTE — Telephone Encounter (Signed)
 Pharmacy Patient Advocate Encounter   Received notification from Patient Advice Request messages that prior authorization for repatha  is required/requested.   Insurance verification completed.   The patient is insured through [employer].   Per test claim: PA required; PA submitted to above mentioned insurance via Latent Key/confirmation #/EOC [id number] Status is pending

## 2024-12-25 NOTE — Telephone Encounter (Signed)
 Pharmacy Patient Advocate Encounter  Received notification from [facility] that Prior Authorization for repatha  has been DENIED.  Full denial letter will be uploaded to the media tab. See denial reason below.     Insurance wants praluent . The patient has never had praluent .   Pharmacy Patient Advocate Encounter   Received notification from Pt Calls Messages that prior authorization for praluent  is required/requested.   Insurance verification completed.   The patient is insured through [employer].   Per test claim: PA required; PA submitted to above mentioned insurance via Latent Key/confirmation #/EOC [id number] Status is pending

## 2024-12-27 NOTE — Telephone Encounter (Signed)
 Hi, the patient's insurance denied the repatha  stating she needs to try and fail praluent  first. They approved the praluent , if that can be sent in to her pharmacy? Also, the patient will need told about the praluent  as well since she was expecting repatha    Pharmacy Patient Advocate Encounter  Received notification from [facility] that Prior Authorization for praluent  has been APPROVED from 12/26/24 to 12/26/25   PA #/Case ID/Reference #: [id number]

## 2024-12-29 ENCOUNTER — Other Ambulatory Visit (HOSPITAL_COMMUNITY): Payer: Self-pay

## 2024-12-29 MED ORDER — PRALUENT 75 MG/ML ~~LOC~~ SOAJ: 75.0000 mg | SUBCUTANEOUS | 3 refills | Status: AC

## 2024-12-29 NOTE — Telephone Encounter (Signed)
 Called patient and notified her of the following from Dr. Gollan.  I am looking but cannot find documentation where we switched from Vytorin  to Repatha /Praluent    She is welcome to try Praluent  if wanting to switch from Vytorin , we can send in prescription Appears Repatha  is not covered Thx TGollan  Patient states that she would like to start Praluent  but that she will need a prior authorization. Prescription sent to preferred pharmacy. Will forward to pharmacy team.

## 2024-12-29 NOTE — Telephone Encounter (Signed)
 Called patient and notified of the following.  Hi, this is filled at her pharmacy under [employer] and her prior authorization was approved under [employer]     Received notification from [facility] that Prior Authorization for praluent  has been APPROVED from 12/26/24 to 12/26/25    Patient verbalizes appreciation for the call.

## 2024-12-29 NOTE — Addendum Note (Signed)
 Addended by: CRISTOPHER OLIVIA PARAS on: 12/29/2024 03:57 PM   Modules accepted: Orders

## 2025-01-16 ENCOUNTER — Encounter: Admitting: Obstetrics & Gynecology
